# Patient Record
Sex: Female | Born: 1935 | Race: White | Hispanic: No | Marital: Married | Smoking: Never smoker
Health system: Southern US, Community
[De-identification: ages and names within clinical notes are randomized; demographics above are authoritative.]

## PROBLEM LIST (undated history)

## (undated) DIAGNOSIS — R112 Nausea with vomiting, unspecified: Secondary | ICD-10-CM

## (undated) DIAGNOSIS — Z9889 Other specified postprocedural states: Secondary | ICD-10-CM

## (undated) DIAGNOSIS — E43 Unspecified severe protein-calorie malnutrition: Secondary | ICD-10-CM

## (undated) DIAGNOSIS — C8590 Non-Hodgkin lymphoma, unspecified, unspecified site: Secondary | ICD-10-CM

## (undated) DIAGNOSIS — R251 Tremor, unspecified: Secondary | ICD-10-CM

## (undated) HISTORY — DX: Hereditary hemochromatosis: E83.110

## (undated) HISTORY — PX: OTHER SURGICAL HISTORY: SHX169

## (undated) HISTORY — PX: ABDOMINAL HYSTERECTOMY: SHX81

## (undated) HISTORY — PX: CHOLECYSTECTOMY: SHX55

## (undated) HISTORY — PX: TONSILLECTOMY: SUR1361

## (undated) HISTORY — PX: CATARACT EXTRACTION, BILATERAL: SHX1313

## (undated) HISTORY — PX: JOINT REPLACEMENT: SHX530

---

## 1998-09-08 ENCOUNTER — Other Ambulatory Visit: Admission: RE | Admit: 1998-09-08 | Discharge: 1998-09-08 | Payer: Self-pay | Admitting: Obstetrics and Gynecology

## 1999-08-07 ENCOUNTER — Other Ambulatory Visit: Admission: RE | Admit: 1999-08-07 | Discharge: 1999-08-07 | Payer: Self-pay | Admitting: Obstetrics and Gynecology

## 2000-07-14 ENCOUNTER — Ambulatory Visit (HOSPITAL_COMMUNITY): Admission: RE | Admit: 2000-07-14 | Discharge: 2000-07-14 | Payer: Self-pay | Admitting: Obstetrics and Gynecology

## 2000-07-14 ENCOUNTER — Encounter: Payer: Self-pay | Admitting: Obstetrics and Gynecology

## 2000-07-19 ENCOUNTER — Encounter: Admission: RE | Admit: 2000-07-19 | Discharge: 2000-07-19 | Payer: Self-pay | Admitting: Obstetrics and Gynecology

## 2000-07-19 ENCOUNTER — Encounter: Payer: Self-pay | Admitting: Obstetrics and Gynecology

## 2000-10-04 ENCOUNTER — Other Ambulatory Visit: Admission: RE | Admit: 2000-10-04 | Discharge: 2000-10-04 | Payer: Self-pay | Admitting: Obstetrics and Gynecology

## 2001-07-20 ENCOUNTER — Encounter: Payer: Self-pay | Admitting: Obstetrics and Gynecology

## 2001-07-20 ENCOUNTER — Ambulatory Visit (HOSPITAL_COMMUNITY): Admission: RE | Admit: 2001-07-20 | Discharge: 2001-07-20 | Payer: Self-pay | Admitting: Obstetrics and Gynecology

## 2002-01-23 ENCOUNTER — Other Ambulatory Visit: Admission: RE | Admit: 2002-01-23 | Discharge: 2002-01-23 | Payer: Self-pay | Admitting: Obstetrics and Gynecology

## 2002-08-03 ENCOUNTER — Encounter: Admission: RE | Admit: 2002-08-03 | Discharge: 2002-08-03 | Payer: Self-pay | Admitting: Internal Medicine

## 2002-08-03 ENCOUNTER — Encounter: Payer: Self-pay | Admitting: Obstetrics and Gynecology

## 2003-08-30 ENCOUNTER — Encounter: Payer: Self-pay | Admitting: Internal Medicine

## 2003-08-30 ENCOUNTER — Inpatient Hospital Stay (HOSPITAL_COMMUNITY): Admission: EM | Admit: 2003-08-30 | Discharge: 2003-09-05 | Payer: Self-pay | Admitting: Emergency Medicine

## 2004-03-05 ENCOUNTER — Ambulatory Visit (HOSPITAL_COMMUNITY): Admission: RE | Admit: 2004-03-05 | Discharge: 2004-03-05 | Payer: Self-pay | Admitting: Internal Medicine

## 2004-03-24 ENCOUNTER — Encounter (HOSPITAL_COMMUNITY): Admission: RE | Admit: 2004-03-24 | Discharge: 2004-06-22 | Payer: Self-pay | Admitting: Internal Medicine

## 2004-06-23 ENCOUNTER — Encounter (HOSPITAL_COMMUNITY): Admission: RE | Admit: 2004-06-23 | Discharge: 2004-09-21 | Payer: Self-pay | Admitting: Internal Medicine

## 2004-07-20 ENCOUNTER — Observation Stay (HOSPITAL_COMMUNITY): Admission: AD | Admit: 2004-07-20 | Discharge: 2004-07-20 | Payer: Self-pay | Admitting: Orthopedic Surgery

## 2005-04-15 ENCOUNTER — Ambulatory Visit (HOSPITAL_COMMUNITY): Admission: RE | Admit: 2005-04-15 | Discharge: 2005-04-15 | Payer: Self-pay | Admitting: Obstetrics and Gynecology

## 2005-09-21 ENCOUNTER — Ambulatory Visit: Payer: Self-pay | Admitting: Hematology & Oncology

## 2005-11-23 ENCOUNTER — Ambulatory Visit: Payer: Self-pay | Admitting: Hematology & Oncology

## 2006-01-25 ENCOUNTER — Ambulatory Visit: Payer: Self-pay | Admitting: Hematology & Oncology

## 2006-04-11 ENCOUNTER — Ambulatory Visit: Payer: Self-pay | Admitting: Hematology & Oncology

## 2006-04-14 LAB — CBC WITH DIFFERENTIAL/PLATELET
Basophils Absolute: 0 10*3/uL (ref 0.0–0.1)
Eosinophils Absolute: 0.2 10*3/uL (ref 0.0–0.5)
HGB: 15.1 g/dL (ref 11.6–15.9)
MCV: 93.9 fL (ref 81.0–101.0)
MONO%: 6.1 % (ref 0.0–13.0)
NEUT#: 6.4 10*3/uL (ref 1.5–6.5)
RDW: 13.3 % (ref 11.3–14.5)

## 2006-05-10 ENCOUNTER — Ambulatory Visit (HOSPITAL_COMMUNITY): Admission: RE | Admit: 2006-05-10 | Discharge: 2006-05-10 | Payer: Self-pay | Admitting: Obstetrics and Gynecology

## 2006-06-10 ENCOUNTER — Ambulatory Visit: Payer: Self-pay | Admitting: Hematology & Oncology

## 2006-06-15 LAB — CBC WITH DIFFERENTIAL/PLATELET
BASO%: 0.4 % (ref 0.0–2.0)
Basophils Absolute: 0 10*3/uL (ref 0.0–0.1)
Eosinophils Absolute: 0.2 10*3/uL (ref 0.0–0.5)
HCT: 45.5 % (ref 34.8–46.6)
HGB: 15.9 g/dL (ref 11.6–15.9)
MONO#: 0.8 10*3/uL (ref 0.1–0.9)
NEUT#: 7.7 10*3/uL — ABNORMAL HIGH (ref 1.5–6.5)
NEUT%: 66.8 % (ref 39.6–76.8)
WBC: 11.6 10*3/uL — ABNORMAL HIGH (ref 3.9–10.0)
lymph#: 2.8 10*3/uL (ref 0.9–3.3)

## 2006-08-08 ENCOUNTER — Ambulatory Visit: Payer: Self-pay | Admitting: Hematology & Oncology

## 2006-08-10 ENCOUNTER — Encounter: Admission: RE | Admit: 2006-08-10 | Discharge: 2006-08-10 | Payer: Self-pay | Admitting: Orthopedic Surgery

## 2006-08-10 LAB — CBC WITH DIFFERENTIAL/PLATELET
Eosinophils Absolute: 0.2 10*3/uL (ref 0.0–0.5)
HGB: 15.8 g/dL (ref 11.6–15.9)
MONO#: 0.7 10*3/uL (ref 0.1–0.9)
NEUT#: 7.1 10*3/uL — ABNORMAL HIGH (ref 1.5–6.5)
RBC: 4.62 10*6/uL (ref 3.70–5.32)
RDW: 13 % (ref 11.3–14.5)
WBC: 11.2 10*3/uL — ABNORMAL HIGH (ref 3.9–10.0)

## 2006-08-10 LAB — FERRITIN: Ferritin: 32 ng/mL (ref 10–291)

## 2006-08-17 ENCOUNTER — Ambulatory Visit (HOSPITAL_BASED_OUTPATIENT_CLINIC_OR_DEPARTMENT_OTHER): Admission: RE | Admit: 2006-08-17 | Discharge: 2006-08-17 | Payer: Self-pay | Admitting: Orthopedic Surgery

## 2006-11-04 ENCOUNTER — Ambulatory Visit: Payer: Self-pay | Admitting: Hematology & Oncology

## 2006-11-09 LAB — CBC WITH DIFFERENTIAL/PLATELET
Basophils Absolute: 0.1 10*3/uL (ref 0.0–0.1)
Eosinophils Absolute: 0.2 10*3/uL (ref 0.0–0.5)
HGB: 15.9 g/dL (ref 11.6–15.9)
MONO#: 0.8 10*3/uL (ref 0.1–0.9)
NEUT#: 7.3 10*3/uL — ABNORMAL HIGH (ref 1.5–6.5)
RDW: 12.9 % (ref 11.3–14.5)
lymph#: 4.7 10*3/uL — ABNORMAL HIGH (ref 0.9–3.3)

## 2006-11-09 LAB — IRON AND TIBC
%SAT: 23 % (ref 20–55)
Iron: 88 ug/dL (ref 42–145)
TIBC: 377 ug/dL (ref 250–470)
UIBC: 289 ug/dL

## 2006-11-09 LAB — FERRITIN: Ferritin: 58 ng/mL (ref 10–291)

## 2007-02-06 ENCOUNTER — Ambulatory Visit: Payer: Self-pay | Admitting: Hematology & Oncology

## 2007-02-08 LAB — CBC WITH DIFFERENTIAL/PLATELET
BASO%: 0.3 % (ref 0.0–2.0)
Eosinophils Absolute: 0.2 10*3/uL (ref 0.0–0.5)
MCHC: 35.8 g/dL (ref 32.0–36.0)
MCV: 95.9 fL (ref 81.0–101.0)
MONO#: 0.8 10*3/uL (ref 0.1–0.9)
MONO%: 7.7 % (ref 0.0–13.0)
NEUT#: 5.9 10*3/uL (ref 1.5–6.5)
RBC: 4.8 10*6/uL (ref 3.70–5.32)
RDW: 12.9 % (ref 11.3–14.5)
WBC: 10 10*3/uL (ref 3.9–10.0)

## 2007-02-08 LAB — IRON AND TIBC
Iron: 145 ug/dL (ref 42–145)
UIBC: 199 ug/dL

## 2007-02-08 LAB — FERRITIN: Ferritin: 64 ng/mL (ref 10–291)

## 2007-05-08 ENCOUNTER — Ambulatory Visit: Payer: Self-pay | Admitting: Hematology & Oncology

## 2007-05-10 LAB — CBC WITH DIFFERENTIAL/PLATELET
BASO%: 0.6 % (ref 0.0–2.0)
Basophils Absolute: 0.1 10*3/uL (ref 0.0–0.1)
EOS%: 2 % (ref 0.0–7.0)
HCT: 41.5 % (ref 34.8–46.6)
LYMPH%: 39.7 % (ref 14.0–48.0)
MCH: 34.3 pg — ABNORMAL HIGH (ref 26.0–34.0)
MCHC: 35.6 g/dL (ref 32.0–36.0)
MCV: 96.5 fL (ref 81.0–101.0)
MONO%: 6.8 % (ref 0.0–13.0)
NEUT%: 50.9 % (ref 39.6–76.8)
lymph#: 4.9 10*3/uL — ABNORMAL HIGH (ref 0.9–3.3)

## 2007-05-10 LAB — IRON AND TIBC: TIBC: 296 ug/dL (ref 250–470)

## 2007-05-15 ENCOUNTER — Ambulatory Visit (HOSPITAL_COMMUNITY): Admission: RE | Admit: 2007-05-15 | Discharge: 2007-05-15 | Payer: Self-pay | Admitting: Obstetrics and Gynecology

## 2007-08-14 ENCOUNTER — Ambulatory Visit: Payer: Self-pay | Admitting: Hematology & Oncology

## 2007-08-16 LAB — CBC WITH DIFFERENTIAL/PLATELET
Eosinophils Absolute: 0.3 10*3/uL (ref 0.0–0.5)
LYMPH%: 28.2 % (ref 14.0–48.0)
MCH: 35 pg — ABNORMAL HIGH (ref 26.0–34.0)
MCV: 97.1 fL (ref 81.0–101.0)
MONO%: 9 % (ref 0.0–13.0)
NEUT#: 7.1 10*3/uL — ABNORMAL HIGH (ref 1.5–6.5)
Platelets: 308 10*3/uL (ref 145–400)
RBC: 4.22 10*6/uL (ref 3.70–5.32)

## 2007-08-16 LAB — COMPREHENSIVE METABOLIC PANEL
Alkaline Phosphatase: 67 U/L (ref 39–117)
BUN: 25 mg/dL — ABNORMAL HIGH (ref 6–23)
Glucose, Bld: 118 mg/dL — ABNORMAL HIGH (ref 70–99)
Sodium: 138 mEq/L (ref 135–145)
Total Bilirubin: 0.7 mg/dL (ref 0.3–1.2)

## 2007-08-16 LAB — IRON AND TIBC
%SAT: 41 % (ref 20–55)
Iron: 126 ug/dL (ref 42–145)
UIBC: 180 ug/dL

## 2007-11-06 ENCOUNTER — Ambulatory Visit: Payer: Self-pay | Admitting: Hematology & Oncology

## 2008-02-05 ENCOUNTER — Ambulatory Visit: Payer: Self-pay | Admitting: Hematology & Oncology

## 2008-02-07 LAB — CBC WITH DIFFERENTIAL/PLATELET
BASO%: 0.1 % (ref 0.0–2.0)
Eosinophils Absolute: 0.2 10*3/uL (ref 0.0–0.5)
HCT: 44.3 % (ref 34.8–46.6)
MCHC: 35.1 g/dL (ref 32.0–36.0)
MONO#: 0.9 10*3/uL (ref 0.1–0.9)
NEUT#: 6.2 10*3/uL (ref 1.5–6.5)
RBC: 4.65 10*6/uL (ref 3.70–5.32)
WBC: 11.1 10*3/uL — ABNORMAL HIGH (ref 3.9–10.0)
lymph#: 3.8 10*3/uL — ABNORMAL HIGH (ref 0.9–3.3)

## 2008-02-07 LAB — FERRITIN: Ferritin: 72 ng/mL (ref 10–291)

## 2008-05-06 ENCOUNTER — Ambulatory Visit: Payer: Self-pay | Admitting: Hematology & Oncology

## 2008-05-19 LAB — FERRITIN: Ferritin: 69 ng/mL (ref 10–291)

## 2008-06-06 ENCOUNTER — Ambulatory Visit (HOSPITAL_BASED_OUTPATIENT_CLINIC_OR_DEPARTMENT_OTHER): Admission: RE | Admit: 2008-06-06 | Discharge: 2008-06-06 | Payer: Self-pay | Admitting: Obstetrics and Gynecology

## 2008-06-17 ENCOUNTER — Encounter: Admission: RE | Admit: 2008-06-17 | Discharge: 2008-06-17 | Payer: Self-pay | Admitting: Obstetrics and Gynecology

## 2008-08-13 ENCOUNTER — Ambulatory Visit: Payer: Self-pay | Admitting: Hematology & Oncology

## 2008-08-14 LAB — HEPATIC FUNCTION PANEL
ALT: 12 U/L (ref 0–35)
Alkaline Phosphatase: 75 U/L (ref 39–117)
Bilirubin, Direct: 0.1 mg/dL (ref 0.0–0.3)
Indirect Bilirubin: 0.7 mg/dL (ref 0.0–0.9)
Total Protein: 6.9 g/dL (ref 6.0–8.3)

## 2008-08-14 LAB — CBC WITH DIFFERENTIAL (CANCER CENTER ONLY)
BASO%: 1.1 % (ref 0.0–2.0)
EOS%: 3 % (ref 0.0–7.0)
LYMPH%: 34.1 % (ref 14.0–48.0)
MCH: 33.1 pg (ref 26.0–34.0)
MCHC: 34.7 g/dL (ref 32.0–36.0)
MCV: 95 fL (ref 81–101)
MONO#: 0.5 10*3/uL (ref 0.1–0.9)
MONO%: 5.7 % (ref 0.0–13.0)
Platelets: 284 10*3/uL (ref 145–400)
RDW: 11.4 % (ref 10.5–14.6)
WBC: 9.5 10*3/uL (ref 3.9–10.0)

## 2008-08-14 LAB — FERRITIN: Ferritin: 80 ng/mL (ref 10–291)

## 2008-08-14 LAB — IRON AND TIBC: TIBC: 318 ug/dL (ref 250–470)

## 2008-12-04 ENCOUNTER — Ambulatory Visit: Payer: Self-pay | Admitting: Hematology & Oncology

## 2009-03-03 ENCOUNTER — Inpatient Hospital Stay (HOSPITAL_COMMUNITY): Admission: RE | Admit: 2009-03-03 | Discharge: 2009-03-07 | Payer: Self-pay | Admitting: Orthopedic Surgery

## 2009-03-04 ENCOUNTER — Ambulatory Visit: Payer: Self-pay | Admitting: Physical Medicine & Rehabilitation

## 2009-04-01 ENCOUNTER — Encounter: Admission: RE | Admit: 2009-04-01 | Discharge: 2009-05-29 | Payer: Self-pay | Admitting: Orthopedic Surgery

## 2009-04-08 ENCOUNTER — Ambulatory Visit: Payer: Self-pay | Admitting: Hematology & Oncology

## 2009-04-17 LAB — CBC WITH DIFFERENTIAL (CANCER CENTER ONLY)
Eosinophils Absolute: 0.4 10*3/uL (ref 0.0–0.5)
LYMPH%: 36 % (ref 14.0–48.0)
MCH: 34.8 pg — ABNORMAL HIGH (ref 26.0–34.0)
MCV: 101 fL (ref 81–101)
MONO%: 7.7 % (ref 0.0–13.0)
NEUT%: 52.3 % (ref 39.6–80.0)
Platelets: 372 10*3/uL (ref 145–400)
RBC: 4.34 10*6/uL (ref 3.70–5.32)

## 2009-04-17 LAB — HEPATIC FUNCTION PANEL
ALT: 11 U/L (ref 0–35)
Alkaline Phosphatase: 102 U/L (ref 39–117)
Bilirubin, Direct: 0.1 mg/dL (ref 0.0–0.3)
Indirect Bilirubin: 0.2 mg/dL (ref 0.0–0.9)
Total Protein: 6.6 g/dL (ref 6.0–8.3)

## 2009-04-17 LAB — IRON AND TIBC
%SAT: 37 % (ref 20–55)
Iron: 116 ug/dL (ref 42–145)

## 2009-04-22 ENCOUNTER — Emergency Department (HOSPITAL_BASED_OUTPATIENT_CLINIC_OR_DEPARTMENT_OTHER): Admission: EM | Admit: 2009-04-22 | Discharge: 2009-04-23 | Payer: Self-pay | Admitting: Emergency Medicine

## 2009-04-29 LAB — CBC WITH DIFFERENTIAL (CANCER CENTER ONLY)
BASO#: 0.2 10*3/uL (ref 0.0–0.2)
Eosinophils Absolute: 0.3 10*3/uL (ref 0.0–0.5)
HGB: 12.9 g/dL (ref 11.6–15.9)
LYMPH%: 34.7 % (ref 14.0–48.0)
MCH: 33.8 pg (ref 26.0–34.0)
MCV: 101 fL (ref 81–101)
MONO%: 8.3 % (ref 0.0–13.0)
NEUT#: 7.2 10*3/uL — ABNORMAL HIGH (ref 1.5–6.5)
RBC: 3.82 10*6/uL (ref 3.70–5.32)

## 2009-07-16 ENCOUNTER — Ambulatory Visit: Payer: Self-pay | Admitting: Diagnostic Radiology

## 2009-07-16 ENCOUNTER — Ambulatory Visit (HOSPITAL_BASED_OUTPATIENT_CLINIC_OR_DEPARTMENT_OTHER): Admission: RE | Admit: 2009-07-16 | Discharge: 2009-07-16 | Payer: Self-pay | Admitting: Obstetrics and Gynecology

## 2009-08-05 ENCOUNTER — Ambulatory Visit: Payer: Self-pay | Admitting: Hematology & Oncology

## 2009-12-09 ENCOUNTER — Ambulatory Visit: Payer: Self-pay | Admitting: Hematology & Oncology

## 2009-12-11 LAB — CBC WITH DIFFERENTIAL (CANCER CENTER ONLY)
BASO#: 0.1 10*3/uL (ref 0.0–0.2)
EOS%: 2 % (ref 0.0–7.0)
Eosinophils Absolute: 0.2 10*3/uL (ref 0.0–0.5)
HCT: 46.3 % (ref 34.8–46.6)
HGB: 15.7 g/dL (ref 11.6–15.9)
MCV: 98 fL (ref 81–101)
MONO%: 7.2 % (ref 0.0–13.0)
NEUT%: 51.6 % (ref 39.6–80.0)
RDW: 11.4 % (ref 10.5–14.6)
WBC: 11.2 10*3/uL — ABNORMAL HIGH (ref 3.9–10.0)

## 2009-12-11 LAB — HEPATIC FUNCTION PANEL
AST: 14 U/L (ref 0–37)
Albumin: 4 g/dL (ref 3.5–5.2)
Total Bilirubin: 0.6 mg/dL (ref 0.3–1.2)

## 2009-12-11 LAB — AFP TUMOR MARKER: AFP-Tumor Marker: 3.9 ng/mL (ref 0.0–8.0)

## 2010-04-06 ENCOUNTER — Inpatient Hospital Stay (HOSPITAL_COMMUNITY): Admission: RE | Admit: 2010-04-06 | Discharge: 2010-04-11 | Payer: Self-pay | Admitting: Orthopedic Surgery

## 2010-05-27 ENCOUNTER — Ambulatory Visit: Payer: Self-pay | Admitting: Hematology & Oncology

## 2010-06-30 ENCOUNTER — Ambulatory Visit: Payer: Self-pay | Admitting: Hematology & Oncology

## 2010-07-01 LAB — CBC WITH DIFFERENTIAL (CANCER CENTER ONLY)
BASO#: 0.1 10*3/uL (ref 0.0–0.2)
Eosinophils Absolute: 0.2 10*3/uL (ref 0.0–0.5)
HCT: 43 % (ref 34.8–46.6)
HGB: 14.8 g/dL (ref 11.6–15.9)
LYMPH#: 3.5 10*3/uL — ABNORMAL HIGH (ref 0.9–3.3)
MCHC: 34.3 g/dL (ref 32.0–36.0)
NEUT#: 6 10*3/uL (ref 1.5–6.5)
NEUT%: 56.6 % (ref 39.6–80.0)
WBC: 10.6 10*3/uL — ABNORMAL HIGH (ref 3.9–10.0)

## 2010-07-01 LAB — FERRITIN: Ferritin: 35 ng/mL (ref 10–291)

## 2010-07-01 LAB — TECHNOLOGIST REVIEW CHCC SATELLITE

## 2010-08-17 ENCOUNTER — Ambulatory Visit (HOSPITAL_BASED_OUTPATIENT_CLINIC_OR_DEPARTMENT_OTHER): Admission: RE | Admit: 2010-08-17 | Discharge: 2010-08-17 | Payer: Self-pay | Admitting: Obstetrics and Gynecology

## 2010-08-17 ENCOUNTER — Ambulatory Visit: Payer: Self-pay | Admitting: Diagnostic Radiology

## 2010-12-02 ENCOUNTER — Ambulatory Visit: Payer: Self-pay | Admitting: Hematology & Oncology

## 2010-12-03 LAB — COMPREHENSIVE METABOLIC PANEL
ALT: <8 U/L (ref 0–35)
AST: 14 U/L (ref 0–37)
Albumin: 4.2 g/dL (ref 3.5–5.2)
Alkaline Phosphatase: 96 U/L (ref 39–117)
BUN: 20 mg/dL (ref 6–23)
CO2: 25 mEq/L (ref 19–32)
Calcium: 8.9 mg/dL (ref 8.4–10.5)
Chloride: 105 mEq/L (ref 96–112)
Creatinine, Ser: 0.69 mg/dL (ref 0.40–1.20)
Glucose, Bld: 115 mg/dL — ABNORMAL HIGH (ref 70–99)
Potassium: 4.2 mEq/L (ref 3.5–5.3)
Sodium: 139 mEq/L (ref 135–145)
Total Bilirubin: 1.1 mg/dL (ref 0.3–1.2)
Total Protein: 6.7 g/dL (ref 6.0–8.3)

## 2010-12-03 LAB — CBC WITH DIFFERENTIAL (CANCER CENTER ONLY)
BASO#: 0.2 10*3/uL (ref 0.0–0.2)
BASO%: 1.3 % (ref 0.0–2.0)
EOS%: 2.2 % (ref 0.0–7.0)
Eosinophils Absolute: 0.3 10*3/uL (ref 0.0–0.5)
HCT: 43.4 % (ref 34.8–46.6)
HGB: 15 g/dL (ref 11.6–15.9)
LYMPH#: 3.6 10*3/uL — ABNORMAL HIGH (ref 0.9–3.3)
LYMPH%: 28.9 % (ref 14.0–48.0)
MCH: 34 pg (ref 26.0–34.0)
MCHC: 34.6 g/dL (ref 32.0–36.0)
MCV: 98 fL (ref 81–101)
MONO#: 0.9 10*3/uL (ref 0.1–0.9)
MONO%: 7 % (ref 0.0–13.0)
NEUT#: 7.4 10*3/uL — ABNORMAL HIGH (ref 1.5–6.5)
NEUT%: 60.6 % (ref 39.6–80.0)
Platelets: 325 10*3/uL (ref 145–400)
RBC: 4.43 10*6/uL (ref 3.70–5.32)
RDW: 10.6 % (ref 10.5–14.6)
WBC: 12.3 10*3/uL — ABNORMAL HIGH (ref 3.9–10.0)

## 2010-12-03 LAB — FERRITIN: Ferritin: 46 ng/mL (ref 10–291)

## 2010-12-12 ENCOUNTER — Encounter: Payer: Self-pay | Admitting: Obstetrics and Gynecology

## 2010-12-13 ENCOUNTER — Encounter: Payer: Self-pay | Admitting: Obstetrics and Gynecology

## 2011-02-08 LAB — CBC
HCT: 37.4 % (ref 36.0–46.0)
HCT: 41.5 % (ref 36.0–46.0)
HCT: 41.7 % (ref 36.0–46.0)
Hemoglobin: 13.2 g/dL (ref 12.0–15.0)
Hemoglobin: 14.4 g/dL (ref 12.0–15.0)
Hemoglobin: 14.6 g/dL (ref 12.0–15.0)
MCHC: 34.5 g/dL (ref 30.0–36.0)
MCHC: 35.4 g/dL (ref 30.0–36.0)
MCV: 100.4 fL — ABNORMAL HIGH (ref 78.0–100.0)
MCV: 102.6 fL — ABNORMAL HIGH (ref 78.0–100.0)
MCV: 103.4 fL — ABNORMAL HIGH (ref 78.0–100.0)
Platelets: 285 10*3/uL (ref 150–400)
RBC: 3.81 MIL/uL — ABNORMAL LOW (ref 3.87–5.11)
RBC: 4.16 MIL/uL (ref 3.87–5.11)
RBC: 4.18 MIL/uL (ref 3.87–5.11)
RDW: 12.5 % (ref 11.5–15.5)
RDW: 12.6 % (ref 11.5–15.5)
RDW: 13 % (ref 11.5–15.5)
RDW: 13 % (ref 11.5–15.5)
WBC: 14.2 10*3/uL — ABNORMAL HIGH (ref 4.0–10.5)
WBC: 17.9 10*3/uL — ABNORMAL HIGH (ref 4.0–10.5)
WBC: 20.2 10*3/uL — ABNORMAL HIGH (ref 4.0–10.5)

## 2011-02-08 LAB — PROTIME-INR
INR: 1.5 — ABNORMAL HIGH (ref 0.00–1.49)
INR: 1.95 — ABNORMAL HIGH (ref 0.00–1.49)
INR: 2.09 — ABNORMAL HIGH (ref 0.00–1.49)
Prothrombin Time: 14.3 seconds (ref 11.6–15.2)
Prothrombin Time: 23.3 seconds — ABNORMAL HIGH (ref 11.6–15.2)

## 2011-02-08 LAB — BASIC METABOLIC PANEL
CO2: 24 mEq/L (ref 19–32)
Calcium: 8.4 mg/dL (ref 8.4–10.5)
Potassium: 3.4 mEq/L — ABNORMAL LOW (ref 3.5–5.1)
Sodium: 130 mEq/L — ABNORMAL LOW (ref 135–145)

## 2011-02-08 LAB — GLUCOSE, CAPILLARY: Glucose-Capillary: 148 mg/dL — ABNORMAL HIGH (ref 70–99)

## 2011-02-09 LAB — TYPE AND SCREEN: ABO/RH(D): O POS

## 2011-02-09 LAB — PROTIME-INR
INR: 0.94 (ref 0.00–1.49)
Prothrombin Time: 12.5 seconds (ref 11.6–15.2)

## 2011-02-09 LAB — BASIC METABOLIC PANEL
GFR calc Af Amer: >60 mL/min (ref >60)
GFR calc non Af Amer: >60 mL/min (ref >60)
Potassium: 3.6 mEq/L (ref 3.5–5.1)
Sodium: 137 mEq/L (ref 135–145)

## 2011-02-09 LAB — APTT: aPTT: 30 seconds (ref 24–37)

## 2011-03-03 LAB — URINE MICROSCOPIC-ADD ON

## 2011-03-03 LAB — URINALYSIS, ROUTINE W REFLEX MICROSCOPIC
Glucose, UA: NEGATIVE mg/dL
Ketones, ur: NEGATIVE mg/dL
Nitrite: POSITIVE — AB
Protein, ur: NEGATIVE mg/dL
Urobilinogen, UA: 0.2 mg/dL (ref 0.0–1.0)

## 2011-03-03 LAB — CBC
HCT: 37.2 % (ref 36.0–46.0)
HCT: 45 % (ref 36.0–46.0)
Hemoglobin: 13.8 g/dL (ref 12.0–15.0)
Hemoglobin: 15.7 g/dL — ABNORMAL HIGH (ref 12.0–15.0)
MCHC: 35 g/dL (ref 30.0–36.0)
MCHC: 35.4 g/dL (ref 30.0–36.0)
MCV: 98.3 fL (ref 78.0–100.0)
MCV: 98.9 fL (ref 78.0–100.0)
Platelets: 274 10*3/uL (ref 150–400)
Platelets: 282 10*3/uL (ref 150–400)
Platelets: 283 10*3/uL (ref 150–400)
Platelets: 283 10*3/uL (ref 150–400)
RDW: 12.7 % (ref 11.5–15.5)
RDW: 12.7 % (ref 11.5–15.5)
RDW: 12.8 % (ref 11.5–15.5)
RDW: 12.9 % (ref 11.5–15.5)
WBC: 11.4 10*3/uL — ABNORMAL HIGH (ref 4.0–10.5)
WBC: 17 10*3/uL — ABNORMAL HIGH (ref 4.0–10.5)

## 2011-03-03 LAB — DIFFERENTIAL
Eosinophils Absolute: 0.3 10*3/uL (ref 0.0–0.7)
Eosinophils Relative: 3 % (ref 0–5)
Lymphs Abs: 4.1 10*3/uL — ABNORMAL HIGH (ref 0.7–4.0)
Monocytes Absolute: 0.8 10*3/uL (ref 0.1–1.0)

## 2011-03-03 LAB — BASIC METABOLIC PANEL
BUN: 11 mg/dL (ref 6–23)
BUN: 19 mg/dL (ref 6–23)
CO2: 27 mEq/L (ref 19–32)
Calcium: 8.5 mg/dL (ref 8.4–10.5)
Chloride: 102 mEq/L (ref 96–112)
Chloride: 98 mEq/L (ref 96–112)
Creatinine, Ser: 0.68 mg/dL (ref 0.4–1.2)
GFR calc Af Amer: >60 mL/min (ref >60)
Glucose, Bld: 137 mg/dL — ABNORMAL HIGH (ref 70–99)
Glucose, Bld: 91 mg/dL (ref 70–99)
Potassium: 4.3 mEq/L (ref 3.5–5.1)

## 2011-03-03 LAB — PROTIME-INR
INR: 1.1 (ref 0.00–1.49)
INR: 1.3 (ref 0.00–1.49)
Prothrombin Time: 12.3 seconds (ref 11.6–15.2)
Prothrombin Time: 14 seconds (ref 11.6–15.2)
Prothrombin Time: 20.6 seconds — ABNORMAL HIGH (ref 11.6–15.2)

## 2011-03-03 LAB — TYPE AND SCREEN: Antibody Screen: NEGATIVE

## 2011-03-03 LAB — ABO/RH: ABO/RH(D): O POS

## 2011-04-06 NOTE — Discharge Summary (Signed)
[name], [name]          ACCOUNT NO.:  [id number]   MEDICAL RECORD NO.:  [id number]          PATIENT TYPE:  REC   LOCATION:  [facility]                         FACILITY:  [facility]   PHYSICIAN:  Feliberto Gottron. Turner Daniels, M.D.   DATE OF BIRTH:  05-31-1931   DATE OF ADMISSION:  04/01/2009  DATE OF DISCHARGE:                               DISCHARGE SUMMARY   CHIEF COMPLAINT:  Left knee pain.   HISTORY OF PRESENT ILLNESS:  This is a 77 year old lady who complains of  severe unremitting pain in her left knee despite conservative treatment  with arthroscopy and steroid injections.  She desires a surgical  intervention at this time.  All risks and benefits were discussed with  the patient.   PAST MEDICAL HISTORY:  Significant for a pulmonary embolism in 2003 and  hematochromatosis.   SURGICAL HISTORY:  She has had hysterectomy, cholecystectomy, and left  knee arthroscopy.   SOCIAL HISTORY:  She denies the use of tobacco and drinks occasional  alcohol.   FAMILY HISTORY:  Positive for coronary artery disease and hypertension.   ALLERGIES:  She has no known drug allergies.   CURRENT MEDICATIONS:  1. Maxzide 25 mg 1 p.o. daily.  2. Inderal 40 mg 1 p.o. b.i.d.  3. Neurontin 100 mg 1 p.o. q.i.d.  4. Vitamin D 50,000 units twice weekly.   PHYSICAL EXAMINATION:  Gross examination of left knee demonstrates the  patient to have tenderness to palpation along the lateral joint line.  Range of motion is estimated to be 0-120 degrees, these are vascularly  intact.  X-rays demonstrate bone-on-bone degenerative joint disease of  the left knee.   PREOPERATIVE LABORATORIES:  White blood cells 11.4, red blood cells  4.55, hemoglobin 15.7, hematocrit 45, and platelets 283.  PT 12.3, INR  is 0.9, and PTT 31.  Sodium 136 potassium 4.3, chloride 102, glucose 91,  BUN 19, and creatinine 0.72.  Urinalysis demonstrated positive nitrites  with small leukocyte esterase and many bacteria.   HOSPITAL COURSE:  Ms.  Howell was admitted to [facility] on March 03, 2009, and she underwent left total knee arthroplasty.  The procedure  was performed by Dr. Gean Birchwood and the patient tolerated well.  Two  Hemovac drains were placed into the left knee.  A perioperative Foley  catheter was placed.  The patient was in good condition and was  transferred to the orthopedic unit where she was placed on Lovenox and  Coumadin for DVT prophylaxis.  On first postoperative day, the patient  was awake and alert.  She denied any nausea or vomiting and complained  of minimal pain in her left knee.  Hemoglobin was 13.5.  Surgical drains  were removed.  Foley catheter was taken out after physical therapy.   On the second postoperative day, the patient was awake, alert, and  eating well.  Hemoglobin was 13.8 and she was afebrile.  She ambulated  190 feet with physical therapy.   On the third postoperative day, as the patient was passing flatus, but  not stool; she was confused, but this was thought to be secondary to  pain  medication, so her medicines were changed from Percocet to Darvocet  and the patient became much more alert.  Her dressing was changed and  her surgical incision was benign.  She ambulated 150 feet with physical  therapy.   On the fourth postoperative day, the patient was eating well and  ambulates independently.  Her pain was well controlled and she was much  more alert.  Hemoglobin was 13.0 and deemed to be discharged.   DISPOSITION:  Discharged home on March 07, 2009.  She was weightbearing  as tolerated and would return to the clinic in 7-10 days for x-rays and  staple removal.  Home health care will manage her wound.  She is to  continue physical therapy.   DISCHARGE MEDICATIONS:  __________ Darvocet-N 100 one to two tabs p.o.  q.4-6 h. p.r.n. pain and Coumadin dose per the pharmacy.  She is to take  aspirin __________.   FINAL DIAGNOSIS:  End-stage degenerative joint disease of the  left knee.      Wendy Harris, PA      Feliberto Gottron. Turner Daniels, M.D.  Electronically Signed    JW/MEDQ  D:  04/07/2009  T:  04/08/2009  Job:  [id number]

## 2011-04-06 NOTE — Op Note (Signed)
[name], [name]          ACCOUNT NO.:  [id number]   MEDICAL RECORD NO.:  [id number]          PATIENT TYPE:  INP   LOCATION:  5009                         FACILITY:  [facility]   PHYSICIAN:  Feliberto Gottron. Turner Daniels, M.D.   DATE OF BIRTH:  [birth year]   DATE OF PROCEDURE:  03/03/2009  DATE OF DISCHARGE:                               OPERATIVE REPORT   PREOPERATIVE DIAGNOSIS:  End-stage arthritis, left knee.   POSTOPERATIVE DIAGNOSIS:  End-stage arthritis, left knee.   PROCEDURE:  Left total knee arthroplasty using DePuy sigma RP  components, 3 femur, 3 tibia, 10-mm sigma RP bearing, and 35-mm patellar  button.   SURGEON:  Feliberto Gottron. Turner Daniels, MD   FIRST ASSISTANT:  Shirl Harris, PA-C   ANESTHETIC:  General endotracheal.   ESTIMATED BLOOD LOSS:  Minimal.   FLUID REPLACEMENT:  59 mL of crystalloid.   DRAINS PLACED:  Two medium Hemovacs and a Foley catheter.   URINE OUTPUT:  300 mL.   TOURNIQUET TIME:  one hour and 40 minutes.   INDICATIONS FOR PROCEDURE:  A 77 year old woman with end-stage arthritis  of the left knee with a valgus deformity, who has failed conservative  treatment, anti-inflammatory medicines, cortisone injections, and  physical therapy.  Plain radiographs were consistent with bone-on-bone  arthritic changes to the lateral compartment of the left knee on Rosen's  view.  She desires elective left total knee arthroplasty.  Risks and  benefits of surgery discussed, questions answered.   DESCRIPTION OF PROCEDURE:  The patient identified by armband and  underwent left femoral nerve block anesthetic at the block area at [facility].  She was then taken to operating room 4, the appropriate  anesthetic monitors were attached and general endotracheal anesthesia  was induced.  She received preoperative IV antibiotics.  A Foley  catheter was inserted, lateral, post, and foot position applied to the  table and tourniquet applied high to the left thigh.  The left  lower  extremity prepped and draped in usual sterile fashion from the ankle to  the tourniquet.  The standard time-out procedure was performed.  The  limb was wrapped with an Esmarch bandage, tourniquet inflated to 350  mmHg.  We began the procedure itself by making an anterior midline  incision starting one handbreadth above the patella and going over the  patella and then 1 cm medial, 2 and 3 cm distal to the tibial tubercle.  Small bleeders in the skin and subcutaneous tissue were identified and  cauterized.  The transverse retinaculum over the patella was identified,  incised and reflected medially allowing a medial parapatellar  arthrotomy.  The patella was everted.  The prepatellar fat pad resected.  Superficial medial collateral ligament was elevated off the proximal  tibia from anterior to posterior ligament intact distally using  electrocautery.  The knee was then hyperflexed with the patella everted,  exposing the menisci and the cruciate ligaments, which were then removed  with the electrocautery.  Using a posteromedial Z-retractor, McCall  retractor through the notch and a lateral Personal assistant.  The  proximal tibia was then further exposed and entered with  a DePuy step  drill coaxial with the tibial medullary canal.  The intramedullary rod  was then placed in the tibia.  The cutting guide set at 2 degrees  posterior slope and pinned into place allowing resection of about 8-9 mm  of bone medially and 6-7 mm of bone laterally because of the valgus  deformity.  Satisfied with proximal tibial resection, the distal femur  was then entered 2 mm anterior to the PCL origin followed by the IM rod  with a 5-degree left distal femoral cutting guide set at 11 mm and  pinned along the epicondylar axis.  The distal femoral cut was then  accomplished and a lateral femoral condyle as anticipated was deficient  by a couple of mm.  Having said that, we slowly had a good cut on the  lateral  side.  We then sized her #3 left femoral component in the  cutting block and placed an 0 degrees of external rotation and performed  our anterior-posterior chamfer cuts without difficulty followed by the  sigma RP box cut.  We then checked our extension gap and found it to be  excellent.  The patella was measured at 23 mm and felt to fit a 35  button.  The cutting guide was set at 15 and the posterior 8 mm of the  patella resected, sized to 35 button and drilled.  The knee was once  again hyperflexed exposing the proximal femur, which was sized to #3  tibial baseplate.  This was pinned into place followed by the smokestack  in the conical reamer and the Delta fin keel punch.  We then hammered  into place a 3 left trial femoral component and drilled a lug snapped in  a 10-mm sigma RP bearing, brought the knee to full extension and then  flexed to 120 degrees with good patellar tracking.  At this point, the  trial components were removed, all bony surface were water picked, clean  dried with suction and sponges, a double batch of DePuy HV cement with  1500 mg Zinacef was mixed at the back table, applied to all bony  metallic mating surfaces except for the posterior condyles of the femur  itself.  In order, we hammered into place a #3 tibial baseplate and  removed excess cement.  A 3 left femur and removed excess cement and a  35-mm patellar button was squeezed into place with a clamp, a 10-mm  sigma RP bearing, a real spacer was snapped into place and the knee  reduced, the knee was held in full extension, more cement was squeezed  out and removed with the [employer].  The cement was then allowed  to cure.  The clamp taken off the patella.  We checked our tracking one  more time.  Medium Hemovac drains were placed from an anterolateral  approach and the wound was irrigated out one more time with pulse lavage  normal saline.  The parapatellar arthrotomy was closed with running #1  Vicryl  suture.  The subcutaneous tissue with 0-0 and 2-0 undyed Vicryl  suture, and the skin with skin staples.  A dressing of Xeroform 4 x 4  dressing sponges, Webril, and Ace wrap was then applied.  Tourniquet let  down.  The patient was awakened and taken to recovery room without  difficulty.      Feliberto Gottron. Turner Daniels, M.D.  Electronically Signed     FJR/MEDQ  D:  03/03/2009  T:  03/03/2009  Job:  [id number] 

## 2011-04-09 NOTE — Discharge Summary (Signed)
NAME:  [name], [name]                    ACCOUNT NO.:  [id number]   MEDICAL RECORD NO.:  [id number]                   PATIENT TYPE:  INP   LOCATION:  3709                                 FACILITY:  [facility]   PHYSICIAN:  Larina Earthly, M.D.                     DATE OF BIRTH:  1931-07-06   DATE OF ADMISSION:  DATE OF DISCHARGE:  09/05/2003                                 DISCHARGE SUMMARY   DISCHARGE DIAGNOSES:  1. Bilateral pulmonary emboli.  2. Hypertension.  3. History of benign essential tremor.  4. Hypoxia secondary to number 1.  5. Deep vein thrombosis.   DISCHARGE MEDICATIONS:  1. Inderal 40 mg p.o. b.i.d. for benign, essential tremor.  2. Maxzide 75.5/ 25 one p.o. daily for hypertension.  3. Coumadin 1 mg p.o. q.h.s. starting on September 05, 2003, further dosing to     be adjusted by Dr. Jacky Kindle for PT/INR to be collected by [facility].   Chest CT with DVT protocol on August 30, 2003, reveals major pulmonary  artery emboli bilaterally, probable bilateral superficial femoral venous  thrombosis.  A bilateral lower-extremity deep vein thrombosis ultrasound was  performed.  However, official report is not in the chart.   EKG on August 30, 2003, reveals atrial tachycardia, right bundle branch  block, left posterior fascicular block.   LABORATORY DATA:  Admission CBC reveals a white blood cell count 4.8,  hemoglobin 15.1, hematocrit 43.5%, platelets count 319,000.  Discharge  hemoglobin was 12.6, hematocrit 35.5%.   Discharge PT/INR was 3.1 on September 05, 2003.  Admission PT/INR 1.0.  Admission sodium 137, potassium 4.5, glucose 133, BUN 18, creatinine 1.0,  total bilirubin 0.7.  Alkaline phosphatase 91.  AST 26, ALT 25.  Albumin  3.3.  Total bilirubin 0.7.  Total CK 23, CK-MB 2.3, troponin I, 0.03.   HISTORY OF PRESENT ILLNESS:  This is a pleasant 77 year old Caucasian female  who was admitted by Dr. Geoffry Paradise on August 30, 2003, after presenting  to the office  with progressive shortness of breath, with no other  symptomatology over at least the previous 12 hours.  However, upon  retrospect, the patient states that her symptoms might have started seven  days prior.  She denied any nausea, vomiting, chest pain, cough, sputum,  fevers, or chills.  She denied any lower extremity swelling.  She was  subsequently admitted for further management and evaluation.   ADMISSION MEDICATIONS:  1. Multivitamin.  2. Vitamin C.  3. Vitamin E.  4. Calcium.  5. Maxzide.  6. Premarin, 0.9 mg p.o. daily taken for multiple years. The patient does     have a history of a total abdominal hysterectomy in 1975.  7. Inderal.   HOSPITAL COURSE:  The patient underwent CT scan with DVT protocol as  mentioned above, and noted to have significant and extensive bilateral  pulmonary emboli.  She was anticoagulated with heparin with  initiation of  Coumadin protocol approximately one to two days later.  She was followed by  Dr. Shan Levans initially who agreed with management as mentioned above.  Her oxygen saturation was 95% on four liters initially, and her oxygen  requirement decreased steadily throughout her hospitalization.   DISCHARGE CONDITION:  Prior to discharge, she was ambulating with her  daughter, maintain an oxygen saturation of 92% on room air.  She suffered  from no additional respiratory distress or dyspnea.   Her vital signs remained stable, and her CBC with respect to hemoglobin and  hematocrit remained stable as documented in the chart.  She was deemed  appropriate for discharge on September 05, 2003, after an appropriate overlap  of therapeutic or super-therapeutic Coumadin levels with PT/INR and heparin  levels of greater than 48 hours.  Home Health will help manage her Coumadin  dosing, and the laboratory evaluation with respect to PT/INR.                                                  Larina Earthly, M.D.    RA/MEDQ  D:  09/05/2003  T:   09/05/2003  Job:  [id number]   cc:   Geoffry Paradise, M.D.  [address]  [place]  [place] [address]  Fax: [phone]

## 2011-04-09 NOTE — H&P (Signed)
NAME:  [name], [name] NO.:  [id number]   MEDICAL RECORD NO.:  [id number]                   PATIENT TYPE:  INP   LOCATION:                                       FACILITY:  [facility]   PHYSICIAN:  Geoffry Paradise, M.D.               DATE OF BIRTH:  04/12/1931   DATE OF ADMISSION:  08/30/2003  DATE OF DISCHARGE:                                HISTORY & PHYSICAL   CHIEF COMPLAINT:  Shortness of breath.   HISTORY OF PRESENT ILLNESS:  Ms. Zeimet is a pleasant 77 year old white  female with a history of hypertension, benign tremor presenting at this time  with several days of dyspnea.  She has been in excellent health and had her  last checkup approximately December 2003 at which time she was healthy.  She  has had a history of hypertension but no other cardiac risk factors.  She  has been apparently active with no symptomatology until the onset of some  dyspnea initially upon exertion and much more progressive over the last 12  hours.  She has had no nausea or vomiting.  Denies any chest pain.  She has  had no cough, sputum, fever, or chills.  She has had no lower extremity  swelling.  On presenting to the office urgently, she is markedly dyspneic  upon rest and exertion and is admitted for further management.   PAST MEDICAL HISTORY:   ALLERGIES:  NO KNOWN DRUG ALLERGIES   MEDICATIONS:  1. Multivitamin one daily.  2. Vitamin C 500 daily.  3. Vitamin E 200 daily.  4. Calcium 600 mg b.i.d.  5. Maxzide 25 one every day.  6. Premarin 0.9 daily.  7. Inderal 40 mg p.o. b.i.d.   MEDICAL ILLNESSES:  1. Essential hypertension.  2. Benign essential tremor.   Followed by Dr. Anne Hahn.   SURGICAL ILLNESSES:  1. Cholecystectomy in 1975.  2. Appendectomy in 1958.  3. Total abdominal hysterectomy in 1975.  4. Flexible sigmoidoscopy in 2000.   FAMILY HISTORY:  Noncontributory.   SOCIAL HISTORY:  The patient is married, has three children.  She does not  smoke or drink.   PHYSICAL EXAMINATION:  VITAL SIGNS:  Temperature is 98, blood pressure is  140/98, pulse is 120 regular, respiratory rate is 32 no labor at rest.  GENERAL:  Patient generally appears pale and ill.  Mild distress.  SKIN:  Warm, non diaphoretic, warm distally.  HEENT:  Normocephalic, atraumatic individual.  Anicteric.  Visual fields  intact.  Oropharynx benign.  Mild cyanosis perioral.  NECK:  No JVD or bruits.  LUNGS:  Clear.  CARDIOVASCULAR:  Regular, rate and rhythm.  Tachycardic.  No S3 or S4.  No  murmur.  ABDOMEN:  Soft, nontender, no hepatosplenomegaly.  BACK:  No CVA or spinal tenderness.  EXTREMITIES:  No cyanosis, clubbing or edema.  Warm distally.  Intact distal  pulses.  NEUROLOGIC:  Nonfocal.   LABORATORY  DATA:  Chest x-ray, no acute disease.   EKG sinus tachycardia with diffuse ST-T changes throughout the precordium.   O2 saturation 92%.   ASSESSMENT:  1. Dyspnea and electrocardiogram changes as well as sinus tachycardia.     Suspicious for a pulmonary embolism.  Doubt cardiac.  Only risk factor     being hormonal replacement therapy.  2. Essential hypertension, stable.  3. Benign essential tremor.   PLAN:  The patient will be admitted for STAT spiral CT rule out pulmonary  embolism.  She will be empirically anticoagulated and treated with empiric  nitrates pending the CT.  If the CT is indeed negative, a cardiac workup  will ensue.  Should the CT be positive, we will proceed with full aggressive  anticoagulation and discontinue hormonal replacement therapy.  There is  certainly no evidence of underlying malignancy at this time.                                                Geoffry Paradise, M.D.    RA/MEDQ  D:  08/30/2003  T:  08/31/2003  Job:  [id number]

## 2011-04-09 NOTE — Op Note (Signed)
NAME:  [name], [name] NO.:  [id number]   MEDICAL RECORD NO.:  [id number]                   PATIENT TYPE:  OBV   LOCATION:  2861                                 FACILITY:  [facility]   PHYSICIAN:  Feliberto Gottron. Turner Daniels, M.D.                DATE OF BIRTH:  Mar 30, 1931   DATE OF PROCEDURE:  07/20/2004  DATE OF DISCHARGE:                                 OPERATIVE REPORT   PREOPERATIVE DIAGNOSIS:  Grade 1 open left distal radius ulnar fracture or  Colles fracture.   POSTOPERATIVE DIAGNOSIS:  Grade 1 open left distal radius ulnar fracture or  Colles fracture.   PROCEDURE:  Left distal radius irrigation and debridement followed by open  reduction and internal fixation using a four hole two row distal [employer] DVR plate with six distal screws and four proximal screws.   SURGEON:  Feliberto Gottron. Turner Daniels, M.D.   FIRST ASSISTANT:  Skip Mayer, P.A.-C.   ANESTHESIA:  General endotracheal anesthesia.   ESTIMATED BLOOD LOSS:  Minimal.   FLUIDS REPLACED:  1 liter crystalloid.   DRAINS:  None.   TOURNIQUET TIME:  45 minutes.   INDICATIONS FOR PROCEDURE:  77 year old woman, otherwise healthy, slipped  and fell at home around 3 o'clock this afternoon sustained a grade I open  left distal radius and ulnar fracture, the opening being underneath the  ulna.  She had bayonet apposition and had blood on her arm when we met her.  She was referred by Dr. Syliva Overman.  Radiographs confirm the diagnosis and  she was prepared for urgent irrigation, debridement, open reduction and  internal fixation.   DESCRIPTION OF PROCEDURE:  The patient was identified by arm band and taken  to the operating room at [facility].  Appropriate anesthetic  monitors were attached and general endotracheal anesthesia was induced with  the patient in the supine position.  A tourniquet was applied high to the  left forearm and the left upper extremity prepped and draped in the usual  sterile  fashion from the fingertips to the tourniquet.  The limb was wrapped  with an Esmarch bandage and tourniquet inflated to 300 mmHg.  We began the  procedure by irrigating out the small 0.5 cm wounds over the ulnar styloid  volarly which did not have any gross dirt and appeared to be strictly inside  out injuries.  These were also debrided sharply getting back to healthy  edges of the skin and fat.  We then prepared for open reduction and internal  fixation by making a longitudinal incision over the FCR tendon with a 45  degree angulation distally over the flexor crease that went about 8 mm.  Small bleeders in the skin and subcutaneous tissue were identified and  cauterized.  The FCR flexor sheath was opened and the FCR was retracted  ulnarly.  We then found the fascia underneath the FCR, opened this, as well,  and  immediately dropped down onto the fracture site which had a major distal  fragment that did not appear to be intra-articular.  On the radial side,  there was a bit of comminution with a large butterfly type fragment that was  easily reducible with traction and a little bit of volar flexion of the  wrist.  This wound was also thoroughly irrigated out with normal saline  solution and a combination of Weitlaner, baby Rogelia Rohrer, and baby Colver  retractors were used to give a near anatomic reduction.  We then laid a four  hole major, eight hole distal, DVR plate left on the volar aspect of the  radius.  It fit nicely and the glide hole was then drilled and fixed with a  3.5 by 20 mm screw.  Under C-arm image control, we then checked the position  of the plate, adjusting the glide on the glide hole, and then cinching it  down.  We then fixed the major distal fragment with three distal row screws  and three proximal row screws using the fully threaded locking screws on all  but two of the holes and on two of them, we used partially threaded locking  screws.  Excellent fixation was obtained  and confirmed with C-arm imaging.  The major butterfly fragment actually keyed in nicely and was quite stable  after the distal screws had been placed.  Once again, the wound was  irrigated out with normal saline solution.  The tourniquet was let down,  small bleeders were identified and cauterized.  The subcutaneous tissue was  closed with 3-0 Vicryl suture and the skin loosely with 4-0 running nylon  suture.  A dressing of Xeroform, 4 by 4 dressing sponges, Webril, and an Ace  wrap and a splint were applied.  The patient was then awakened and taken to  the recovery room without difficulty.                                               Feliberto Gottron. Turner Daniels, M.D.    Ovid Curd  D:  07/20/2004  T:  07/20/2004  Job:  [id number]   cc:   Molly Maduro L. Foy [place], M.D.  [address] [place]  [place] [address]  Fax: [phone]

## 2011-04-09 NOTE — Consult Note (Signed)
   NAME:  ALAIJAH, GIBLER NO.:  [id number]   MEDICAL RECORD NO.:  [id number]                   PATIENT TYPE:  INP   LOCATION:  1823                                 FACILITY:  [facility]   PHYSICIAN:  Shan Levans, M.D. [facility]            DATE OF BIRTH:  Nov 14, 1931   DATE OF CONSULTATION:  08/30/2003  DATE OF DISCHARGE:                                   CONSULTATION   REFERRING PHYSICIAN:  Geoffry Paradise, M.D.   CHIEF COMPLAINT:  Evaluate pulmonary embolism.   HISTORY OF PRESENT ILLNESS:  This is a 77 year old white female who has had  a three-day history of increasing weakness, shortness of breath, and  malaise.  Found to have acute pulmonary embolism on CT scan here in the  emergency department.  She has been on long-term Premarin therapy.  Also a  history of hypertension, palpitations, and tremors.  No history of MI or  other major cardiopulmonary history.  No history of hypercoagulable state.  The patient is seen in the emergency room now with need for admission.  Asked by internal medicine to assist in the patient's care.   PAST MEDICAL HISTORY:  1. History of hypertension.  2. Tremors.   MEDICATIONS PRIOR TO ADMISSION:  1. Inderal 40 mg b.i.d.  2. Premarin daily.  3. Aspirin daily.   ALLERGIES:  None.   PAST OPERATIVE HISTORY:  Noncontributory.   FAMILY HISTORY:  Noncontributory.   SOCIAL HISTORY:  Nonsmoker.   REVIEW OF SYSTEMS:  Noncontributory.   PHYSICAL EXAMINATION:  VITAL SIGNS:  Blood pressure 140/98, pulse 120 in  sinus tachycardia, saturation 92% on room air, temperature 98 degrees.  GENERAL APPEARANCE:  This is a well-developed, well-nourished, white female  in no acute distress.  CHEST:  Clear bilaterally to auscultation and percussion.  There is no  evidence of wheeze or rhonchi.  CARDIAC:  Regular rate and rhythm with S3.  Normal S1 and S2.  ABDOMEN:  Soft and nontender.  EXTREMITIES:  No edema or clubbing.  NEUROLOGIC:   Intact.  HEENT:  No jugular venous distention.  No lymphadenopathy.   LABORATORY DATA:  CT scan of the chest showed bilateral pulmonary emboli.   IMPRESSION:  Bilateral pulmonary emboli in a patient with long-term Premarin  usage.   RECOMMENDATIONS:  Agree with IV heparin.  Would start Coumadin within 24  hours.  Agree with admission to stepdown unit.  Will follow with you.                                               Shan Levans, M.D. [facility]    PW/MEDQ  D:  08/30/2003  T:  08/31/2003  Job:  [id number]

## 2011-04-09 NOTE — Op Note (Signed)
NAME:  JINA, OLENICK NO.:  [id number]   MEDICAL RECORD NO.:  [id number]                   PATIENT TYPE:  OBV   LOCATION:  2861                                 FACILITY:  [facility]   PHYSICIAN:  Feliberto Gottron. Turner Daniels, M.D.                DATE OF BIRTH:  10-02-1931   DATE OF PROCEDURE:  07/20/2004  DATE OF DISCHARGE:                                 OPERATIVE REPORT   No dictation for this job number.                                               Feliberto Gottron. Turner Daniels, M.D.    Ovid Curd  D:  07/20/2004  T:  07/20/2004  Job:  [id number]

## 2011-04-09 NOTE — Op Note (Signed)
NAMECAMDEN, [name]          ACCOUNT NO.:  [id number]   MEDICAL RECORD NO.:  [id number]          PATIENT TYPE:  AMB   LOCATION:  DSC                          FACILITY:  [facility]   PHYSICIAN:  Feliberto Gottron. Turner Daniels, M.D.   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   POSTOPERATIVE DIAGNOSES:  1. Lateral meniscal tear and chondromalacia of the left knee.  2. 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   PROCEDURE:  Left knee arthroscopic partial lateral meniscectomy, debridement  of grade III chondromalacia from the medial femoral condyle, lateral femoral  condyle, trochlea and patella focal and chondromalacia of the lateral tibial  condyle, global grade III focal grade IV.   SURGEON:  Feliberto Gottron. Turner Daniels, M.D.   ANESTHESIA:  General LMA with local anesthetic.   ESTIMATED BLOOD LOSS:  Minimal.   FLUID REPLACEMENT:  700 mL crystalloid.   DRAINS PLACED:  None.   TOURNIQUET TIME:  None.   INDICATIONS FOR PROCEDURE:  The patient is a 76 year old woman who has been  followed in my office for arthritic changes of her left knee with a valgus  deformity.  She still has a fairly decent cartilage layer on x-ray with  [facility] grade II to grade III changes and has medial and lateral joint  line pain consistent with meniscal tears or possible loose bodies.  She has  failed conservative treatment and desires elective arthroscopic evaluation  and treatment of her left knee and understands at some point she may  progress to the need for a total knee arthroplasty but this is a relatively  minor lower risk procedure versus the total knee arthroplasty which would  carry more risk and more time and effort on her part.  Risks and benefits of  surgery discussed and she is  prepared for surgical stabilization.   DESCRIPTION OF PROCEDURE:  The patient identified by armband, taken to the  operating room at [facility]. [facility].  Appropriate anesthetic monitors were attached and general LMA anesthesia  induced with the patient supine position.  Lateral post applied to the table  and left lower extremity prepped and draped in the usual sterile fashion  from the ankle to the mid thigh.  We then infiltrated the inferomedial and  inferolateral peripatellar regions with 5-10 mL of 0.50% Marcaine and  epinephrine solution and made standard inferomedial and inferolateral  peripatellar portals allowing introduction of the arthroscope through the  inferolateral portal and the outflow through the inferomedial portal.  Pump  pressure was set between 40 and 80 mmHg.  Diagnostic arthroscopy revealed  grade III chondromalacia of the patella and trochlea focal to the apex of  the patella and the center of the trochlea.  This was debrided back to a  stable margin with a 3.5 gator sucker shaver.  Moving into the medial  compartment, the patient  had focal grade III chondromalacia to the distal  and posterior aspects of the medial femoral condyle and this was debrided as  well as a little bit of incidental debridement of the leading edge of the  medial meniscus.  The ACL and PCL were intact on the lateral side.  The  patient had focal chondromalacia of the lateral femoral condyle grade III  and pretty much global grade III and focal grade IV chondromalacia of the  lateral tibial plateau with flap tears and this was debrided as was  extensive degenerative tearing of the lateral meniscus.  The gutters were  cleared medially and laterally.  There were some small cartilaginous loose  bodies that were taken through the outflow and at this point, the  arthroscopic instruments were removed and a dressing of Xeroform 4x4  dressing sponges, Webril and Ace wrap  applied.  The patient was then  awakened and taken to the recovery room without difficulty.      Feliberto Gottron. Turner Daniels, M.D.  Electronically Signed     FJR/MEDQ  D:  08/17/2006  T:  08/19/2006  Job:  [id number]

## 2011-06-14 ENCOUNTER — Encounter (HOSPITAL_BASED_OUTPATIENT_CLINIC_OR_DEPARTMENT_OTHER): Payer: Medicare Other | Admitting: Hematology & Oncology

## 2011-06-14 ENCOUNTER — Other Ambulatory Visit: Payer: Self-pay | Admitting: Hematology & Oncology

## 2011-06-14 LAB — CBC WITH DIFFERENTIAL (CANCER CENTER ONLY)
MCH: 34.3 pg — ABNORMAL HIGH (ref 26.0–34.0)
MCV: 94 fL (ref 81–101)
Platelets: 247 10*3/uL (ref 145–400)
RBC: 4.55 10*6/uL (ref 3.70–5.32)
RDW: 12.4 % (ref 11.1–15.7)
WBC: 11.3 10*3/uL — ABNORMAL HIGH (ref 3.9–10.0)

## 2011-06-14 LAB — COMPREHENSIVE METABOLIC PANEL
AST: 13 U/L (ref 0–37)
Alkaline Phosphatase: 98 U/L (ref 39–117)
BUN: 29 mg/dL — ABNORMAL HIGH (ref 6–23)
CO2: 19 mEq/L (ref 19–32)
Calcium: 9.1 mg/dL (ref 8.4–10.5)
Chloride: 104 mEq/L (ref 96–112)
Creatinine, Ser: 0.81 mg/dL (ref 0.50–1.10)
Potassium: 4.5 mEq/L (ref 3.5–5.3)
Sodium: 137 mEq/L (ref 135–145)

## 2011-06-14 LAB — IRON AND TIBC
%SAT: 37 % (ref 20–55)
TIBC: 345 ug/dL (ref 250–470)
UIBC: 218 ug/dL

## 2011-06-14 LAB — MANUAL DIFFERENTIAL (CHCC SATELLITE)
ALC: 3.7 10*3/uL — ABNORMAL HIGH (ref 0.6–2.2)
ANC (CHCC HP manual diff): 6.9 10*3/uL — ABNORMAL HIGH (ref 1.5–6.7)
SEG: 61 % (ref 40–75)

## 2011-06-14 LAB — AFP TUMOR MARKER: AFP-Tumor Marker: 3.4 ng/mL (ref 0.0–8.0)

## 2011-07-29 ENCOUNTER — Other Ambulatory Visit (HOSPITAL_COMMUNITY): Payer: Self-pay | Admitting: Obstetrics and Gynecology

## 2011-07-29 DIAGNOSIS — Z1231 Encounter for screening mammogram for malignant neoplasm of breast: Secondary | ICD-10-CM

## 2011-08-20 ENCOUNTER — Ambulatory Visit (HOSPITAL_COMMUNITY): Payer: Medicare Other

## 2011-08-26 ENCOUNTER — Ambulatory Visit (HOSPITAL_COMMUNITY): Payer: Medicare Other

## 2011-09-15 ENCOUNTER — Ambulatory Visit (HOSPITAL_BASED_OUTPATIENT_CLINIC_OR_DEPARTMENT_OTHER)
Admission: RE | Admit: 2011-09-15 | Discharge: 2011-09-15 | Disposition: A | Discharge status: Home / Self Care | Payer: Medicare Other | Source: Ambulatory Visit | Attending: Obstetrics and Gynecology | Admitting: Obstetrics and Gynecology

## 2011-09-15 ENCOUNTER — Inpatient Hospital Stay (HOSPITAL_BASED_OUTPATIENT_CLINIC_OR_DEPARTMENT_OTHER): Admission: RE | Admit: 2011-09-15 | Payer: Medicare Other | Source: Ambulatory Visit

## 2011-09-15 DIAGNOSIS — Z1231 Encounter for screening mammogram for malignant neoplasm of breast: Secondary | ICD-10-CM | POA: Insufficient documentation

## 2011-12-06 ENCOUNTER — Ambulatory Visit: Payer: Medicare Other | Admitting: Hematology & Oncology

## 2011-12-06 ENCOUNTER — Other Ambulatory Visit: Payer: Medicare Other | Admitting: Lab

## 2011-12-23 ENCOUNTER — Telehealth: Payer: Self-pay | Admitting: Hematology & Oncology

## 2011-12-23 NOTE — Telephone Encounter (Signed)
Pt made 2-22 appointment from missed 1-14

## 2012-01-14 ENCOUNTER — Other Ambulatory Visit (HOSPITAL_BASED_OUTPATIENT_CLINIC_OR_DEPARTMENT_OTHER): Payer: Medicare Other | Admitting: Lab

## 2012-01-14 ENCOUNTER — Ambulatory Visit (HOSPITAL_BASED_OUTPATIENT_CLINIC_OR_DEPARTMENT_OTHER): Payer: Medicare Other | Admitting: Hematology & Oncology

## 2012-01-14 ENCOUNTER — Encounter: Payer: Self-pay | Admitting: Hematology & Oncology

## 2012-01-14 HISTORY — DX: Hereditary hemochromatosis: E83.110

## 2012-01-14 LAB — CBC WITH DIFFERENTIAL (CANCER CENTER ONLY)
BASO%: 0.3 % (ref 0.0–2.0)
EOS%: 1.1 % (ref 0.0–7.0)
LYMPH#: 2.8 10*3/uL (ref 0.9–3.3)
MCHC: 35.3 g/dL (ref 32.0–36.0)
MONO#: 0.9 10*3/uL (ref 0.1–0.9)
NEUT#: 9.8 10*3/uL — ABNORMAL HIGH (ref 1.5–6.5)
Platelets: 270 10*3/uL (ref 145–400)
RDW: 12.5 % (ref 11.1–15.7)
WBC: 13.7 10*3/uL — ABNORMAL HIGH (ref 3.9–10.0)

## 2012-01-14 LAB — RETICULOCYTES (CHCC)
RBC.: 3.98 MIL/uL (ref 3.87–5.11)
Retic Ct Pct: 1.6 % (ref 0.4–2.3)

## 2012-01-14 LAB — IRON AND TIBC
%SAT: 35 % (ref 20–55)
TIBC: 332 ug/dL (ref 250–470)
UIBC: 217 ug/dL (ref 125–400)

## 2012-01-14 LAB — HEPATIC FUNCTION PANEL
Alkaline Phosphatase: 95 U/L (ref 39–117)
Bilirubin, Direct: 0.1 mg/dL (ref 0.0–0.3)
Indirect Bilirubin: 0.6 mg/dL (ref 0.0–0.9)
Total Protein: 6.3 g/dL (ref 6.0–8.3)

## 2012-01-14 LAB — CHCC SATELLITE - SMEAR

## 2012-01-14 LAB — FERRITIN: Ferritin: 54 ng/mL (ref 10–291)

## 2012-01-14 NOTE — Progress Notes (Signed)
This office note has been dictated.

## 2012-01-15 NOTE — Progress Notes (Signed)
CC:   Geoffry Paradise, M.D.  DIAGNOSIS:  Hemochromatosis (C2A2T mutation, heterozygote).  CURRENT THERAPY:  Phlebotomy to maintain ferritin less than 100.  INTERIM HISTORY:  Ms. Zuidema comes in for followup.  She is doing well.  She is still recovering from her knee surgeries.  She had 1 knee surgery last year and the other the year before.  She has some balance issues.  She has not had any problems with fatigue or weakness.  She has had no change in her medications.  There has been no change in bowel or bladder habits.  When we last saw her in July of 2012, her ferritin was only 46.  She has had no cough.  She has had no fever.  She has had no bony pain. She has had no double vision or blurred vision.  She has had some swelling down her lower legs.  She noticed this after she had her knee surgeries, and this has been chronic.  PHYSICAL EXAMINATION:  This is a well-developed, well-nourished white female in no obvious distress.  Vital signs:  Temperature 97.8, pulse 68, respiratory rate 18, blood pressure 121/66.  Weight is 189.  Head and neck:  Shows a normocephalic, atraumatic skull.  There are no ocular or oral lesions.  There are no palpable cervical or supraclavicular lymph nodes.  Lungs:  Clear bilaterally.  Cardiac:  Regular rate and rhythm with a normal S1, S2.  There are no murmurs, rubs or bruits. Abdomen:  Soft with good bowel sounds.  There is no palpable abdominal mass.  There is no fluid wave.  There is no palpable hepatosplenomegaly. Back:  No tenderness over the spine, ribs, or hips.  Extremities:  Show no clubbing, cyanosis or edema.  Neurologic:  Shows no focal neurological deficits.  LABORATORY STUDIES:  White cell count is 13.7, hemoglobin 15.6, hematocrit 44.2, platelet count 270.  MCV is 98.  IMPRESSION:  Ms. Barbato is a 78 year old white female with hemochromatosis.  She is a heterozygote for the major mutation.  We have not phlebotomized her for  several years.  When we last saw her, we did check her alpha-fetoprotein.  This was only 3.4.  Her iron saturation was only 37%.  We will go ahead and plan to get her back in 6 more months.  I do not see that we need any blood work in between visits.    ______________________________ Josph Macho, M.D. PRE/MEDQ  D:  01/14/2012  T:  01/14/2012  Job:  [id number]

## 2012-08-03 ENCOUNTER — Other Ambulatory Visit (HOSPITAL_BASED_OUTPATIENT_CLINIC_OR_DEPARTMENT_OTHER): Payer: Medicare Other | Admitting: Lab

## 2012-08-03 ENCOUNTER — Ambulatory Visit (HOSPITAL_BASED_OUTPATIENT_CLINIC_OR_DEPARTMENT_OTHER): Payer: Medicare Other | Admitting: Hematology & Oncology

## 2012-08-03 LAB — CBC WITH DIFFERENTIAL (CANCER CENTER ONLY)
BASO%: 0.5 % (ref 0.0–2.0)
EOS%: 2.2 % (ref 0.0–7.0)
Eosinophils Absolute: 0.2 10*3/uL (ref 0.0–0.5)
LYMPH%: 30.6 % (ref 14.0–48.0)
MCH: 34.2 pg — ABNORMAL HIGH (ref 26.0–34.0)
MCHC: 35.1 g/dL (ref 32.0–36.0)
MCV: 97 fL (ref 81–101)
MONO%: 8 % (ref 0.0–13.0)
NEUT#: 6 10*3/uL (ref 1.5–6.5)
Platelets: 246 10*3/uL (ref 145–400)
RDW: 12.5 % (ref 11.1–15.7)

## 2012-08-03 LAB — FERRITIN: Ferritin: 64 ng/mL (ref 10–291)

## 2012-08-03 LAB — IRON AND TIBC: Iron: 98 ug/dL (ref 42–145)

## 2012-08-03 NOTE — Patient Instructions (Signed)
.  cal if problems

## 2012-08-03 NOTE — Progress Notes (Signed)
This office note has been dictated.

## 2012-08-04 ENCOUNTER — Telehealth: Payer: Self-pay | Admitting: *Deleted

## 2012-08-04 NOTE — Progress Notes (Signed)
CC:   Geoffry Paradise, M.D.  DIAGNOSIS:  Hemochromatosis (C282Ymutation, heterozygote).  CURRENT THERAPY:  Phlebotomy to maintain ferritin less than 100.  INTERIM HISTORY:  [name] comes in for her followup.  She is doing okay.  Her son-in-law passed away this summer.  I took care of him.  He had metastatic prostate cancer.  This been tough on the family. Thankfully, they are a strong family.  When we last saw her, her ferritin was 54.  We have not had to phlebotomize her, I think since I have been seeing her.  She is having some issues with her knees.  She has had knee surgery over the past couple years.  She still has some difficulties with getting around.  She has had no change in bowel or bladder habits.  She has had no nausea or vomiting.  There have been no headaches.  She has had no rashes.  PHYSICAL EXAMINATION:  This is a well-developed, well-nourished white female in no obvious distress.  Vital signs:  Temperature of 97.6, pulse 62, respiratory rate 18, blood pressure 135/55.  Weight is 188.  Head and neck:  Normocephalic, atraumatic skull.  There are no ocular or oral lesions.  There are no palpable cervical or supraclavicular lymph nodes. Lungs:  Clear bilaterally.  Cardiac:  Regular rate and rhythm with a normal S1 and S2.  There are no murmurs, rubs or bruits.  Abdomen:  Soft with good bowel sounds.  There is no palpable abdominal mass.  There is no fluid wave.  There is no palpable hepatosplenomegaly.  Back:  No tenderness over the spine, ribs, or hips.  Extremities:  Bilateral knee replacement scars.  There is some swelling about her knees.  There is some slight swelling in her lower legs, which is chronic.  Neurological: No focal neurological deficits.  LABORATORY STUDIES:  White cell count 10.3, hemoglobin 15.3, hematocrit 43.6, platelet count is 246.  IMPRESSION:  [name] is a 78 year old female with hemochromatosis. She is doing well with this.   I wish she was doing as well with her other health issues.  We will see what her ferritin is.  Again, we have not had to phlebotomize her since we have seen her.  We will plan to have her come back in 3 months just for a blood checked. I will see her back myself in 6 months.    ______________________________ [name], M.D. PRE/MEDQ  D:  08/03/2012  T:  08/04/2012  Job:  [id number]

## 2012-08-04 NOTE — Telephone Encounter (Addendum)
Message copied by Wynonia Hazard on Fri Aug 04, 2012  2:54 PM ------      Message from: Arlan Organ R      Created: Thu Aug 03, 2012  8:40 PM       Call - iron still good. NO need for phlebotomy.  Pete  Left message on voicemail with the above message. To call back if any questions.

## 2012-08-18 ENCOUNTER — Other Ambulatory Visit (HOSPITAL_COMMUNITY): Payer: Self-pay | Admitting: Orthopedic Surgery

## 2012-08-18 DIAGNOSIS — M25562 Pain in left knee: Secondary | ICD-10-CM

## 2012-08-25 ENCOUNTER — Encounter (HOSPITAL_COMMUNITY)
Admission: RE | Admit: 2012-08-25 | Discharge: 2012-08-25 | Disposition: A | Discharge status: Home / Self Care | Payer: Medicare Other | Source: Ambulatory Visit | Attending: Orthopedic Surgery | Admitting: Orthopedic Surgery

## 2012-08-25 DIAGNOSIS — M25562 Pain in left knee: Secondary | ICD-10-CM

## 2012-08-25 DIAGNOSIS — M25569 Pain in unspecified knee: Secondary | ICD-10-CM | POA: Insufficient documentation

## 2012-08-25 MED ORDER — TECHNETIUM TC 99M MEDRONATE IV KIT
27.0000 | PACK | Freq: Once | INTRAVENOUS | Status: AC | PRN
  Administered 2012-08-25: 27 via INTRAVENOUS

## 2012-10-16 ENCOUNTER — Other Ambulatory Visit (HOSPITAL_BASED_OUTPATIENT_CLINIC_OR_DEPARTMENT_OTHER): Payer: Self-pay | Admitting: Obstetrics and Gynecology

## 2012-10-16 DIAGNOSIS — Z1231 Encounter for screening mammogram for malignant neoplasm of breast: Secondary | ICD-10-CM

## 2012-10-27 ENCOUNTER — Ambulatory Visit (HOSPITAL_BASED_OUTPATIENT_CLINIC_OR_DEPARTMENT_OTHER): Payer: Medicare Other

## 2012-11-02 ENCOUNTER — Other Ambulatory Visit: Payer: Medicare Other | Admitting: Lab

## 2012-11-02 ENCOUNTER — Ambulatory Visit (HOSPITAL_BASED_OUTPATIENT_CLINIC_OR_DEPARTMENT_OTHER)
Admission: RE | Admit: 2012-11-02 | Discharge: 2012-11-02 | Disposition: A | Discharge status: Home / Self Care | Payer: Medicare Other | Source: Ambulatory Visit | Attending: Obstetrics and Gynecology | Admitting: Obstetrics and Gynecology

## 2012-11-02 ENCOUNTER — Other Ambulatory Visit (HOSPITAL_BASED_OUTPATIENT_CLINIC_OR_DEPARTMENT_OTHER): Payer: Medicare Other | Admitting: Lab

## 2012-11-02 DIAGNOSIS — Z1231 Encounter for screening mammogram for malignant neoplasm of breast: Secondary | ICD-10-CM | POA: Insufficient documentation

## 2012-11-02 LAB — CBC WITH DIFFERENTIAL (CANCER CENTER ONLY)
BASO#: 0.1 10*3/uL (ref 0.0–0.2)
Eosinophils Absolute: 0.3 10*3/uL (ref 0.0–0.5)
HCT: 42.9 % (ref 34.8–46.6)
HGB: 15 g/dL (ref 11.6–15.9)
LYMPH%: 28.4 % (ref 14.0–48.0)
MCH: 34.2 pg — ABNORMAL HIGH (ref 26.0–34.0)
MCV: 98 fL (ref 81–101)
MONO#: 1.1 10*3/uL — ABNORMAL HIGH (ref 0.1–0.9)
MONO%: 8.8 % (ref 0.0–13.0)
NEUT%: 60.2 % (ref 39.6–80.0)
RBC: 4.38 10*6/uL (ref 3.70–5.32)
WBC: 12.5 10*3/uL — ABNORMAL HIGH (ref 3.9–10.0)

## 2013-01-31 ENCOUNTER — Other Ambulatory Visit: Payer: Medicare Other | Admitting: Lab

## 2013-01-31 ENCOUNTER — Ambulatory Visit: Payer: Medicare Other | Admitting: Hematology & Oncology

## 2013-01-31 ENCOUNTER — Ambulatory Visit: Payer: Medicare Other | Admitting: Medical

## 2013-02-01 ENCOUNTER — Telehealth: Payer: Self-pay | Admitting: Hematology & Oncology

## 2013-02-01 NOTE — Telephone Encounter (Signed)
Pt scheduled 3/27

## 2013-02-01 NOTE — Telephone Encounter (Signed)
Left pt message to call and reschedule missed appointments

## 2013-02-08 ENCOUNTER — Other Ambulatory Visit: Payer: Medicare Other | Admitting: Lab

## 2013-02-08 ENCOUNTER — Ambulatory Visit: Payer: Medicare Other | Admitting: Medical

## 2013-02-15 ENCOUNTER — Ambulatory Visit (HOSPITAL_BASED_OUTPATIENT_CLINIC_OR_DEPARTMENT_OTHER): Payer: Medicare Other | Admitting: Medical

## 2013-02-15 ENCOUNTER — Other Ambulatory Visit (HOSPITAL_BASED_OUTPATIENT_CLINIC_OR_DEPARTMENT_OTHER): Payer: Medicare Other | Admitting: Lab

## 2013-02-15 LAB — CBC WITH DIFFERENTIAL (CANCER CENTER ONLY)
BASO%: 0.4 % (ref 0.0–2.0)
Eosinophils Absolute: 0.2 10*3/uL (ref 0.0–0.5)
HCT: 44.4 % (ref 34.8–46.6)
LYMPH%: 35.6 % (ref 14.0–48.0)
MCH: 33.8 pg (ref 26.0–34.0)
MCV: 97 fL (ref 81–101)
MONO#: 0.9 10*3/uL (ref 0.1–0.9)
MONO%: 8.3 % (ref 0.0–13.0)
NEUT%: 53.9 % (ref 39.6–80.0)
Platelets: 224 10*3/uL (ref 145–400)
RDW: 12.7 % (ref 11.1–15.7)

## 2013-02-15 LAB — IRON AND TIBC
%SAT: 28 % (ref 20–55)
TIBC: 327 ug/dL (ref 250–470)

## 2013-02-15 LAB — FERRITIN: Ferritin: 61 ng/mL (ref 10–291)

## 2013-02-15 NOTE — Progress Notes (Signed)
DIAGNOSIS:  Hemochromatosis (C282Ymutation, heterozygote).  CURRENT THERAPY:  Phlebotomy to maintain ferritin less than 100.  INTERIM HISTORY:  [name] presents today for an office followup visit.  Overall, [name], reports, that [name]'s been doing relatively well.  Back in September her ferritin was 64.  We like to maintain her ferritin less than 100.  We have not had phlebotomize her, since we've been seeing her.  [name] still continues to have some chronic issues with her knees.  [name] does have some difficulty getting around.  [name] otherwise does not report any new problems or medications.  [name] has a good appetite.  [name] denies any nausea, vomiting, diarrhea, constipation, chest pain, shortness of breath, or cough.  [name] denies any fevers, chills, or night sweats.  [name] denies any type of abdominal pain.  [name] denies any lower leg swelling.  [name] denies any obvious, or abnormal bleeding.  [name] denies any headaches, visual changes, or rashes.  Review of Systems: Constitutional:Negative for malaise/fatigue, fever, chills, weight loss, diaphoresis, activity change, appetite change, and unexpected weight change.  HEENT: Negative for double vision, blurred vision, visual loss, ear pain, tinnitus, congestion, rhinorrhea, epistaxis sore throat or sinus disease, oral pain/lesion, tongue soreness Respiratory: Negative for cough, chest tightness, shortness of breath, wheezing and stridor.  Cardiovascular: Negative for chest pain, palpitations, leg swelling, orthopnea, PND, DOE or claudication Gastrointestinal: Negative for nausea, vomiting, abdominal pain, diarrhea, constipation, blood in stool, melena, hematochezia, abdominal distention, anal bleeding, rectal pain, anorexia and hematemesis.  Genitourinary: Negative for dysuria, frequency, hematuria,  Musculoskeletal: Negative for myalgias, back pain, joint swelling, arthralgias and gait problem.  Skin: Negative for rash, color change, pallor and wound.   Neurological:. Negative for dizziness/light-headedness, tremors, seizures, syncope, facial asymmetry, speech difficulty, weakness, numbness, headaches and paresthesias.  Hematological: Negative for adenopathy. Does not bruise/bleed easily.  Psychiatric/Behavioral:  Negative for depression, no loss of interest in normal activity or change in sleep pattern.   Physical Exam: This is a pleasant, 79 year old, well-developed, on March, white female, in no obvious distress Vitals: Temperature 97.8 degrees, pulse 53, respirations 16, blood pressure 141/56, weight 184 pounds HEENT reveals a normocephalic, atraumatic skull, no scleral icterus, no oral lesions  Neck is supple without any cervical or supraclavicular adenopathy.  Lungs are clear to auscultation bilaterally. There are no wheezes, rales or rhonci Cardiac is regular rate and rhythm with a normal S1 and S2. There are no murmurs, rubs, or bruits.  Abdomen is soft with good bowel sounds, there is no palpable mass. There is no palpable hepatosplenomegaly. There is no palpable fluid wave.  Musculoskeletal no tenderness of the spine, ribs, or hips.  Extremities there are no clubbing, cyanosis, or edema.  Skin no petechia, purpura or ecchymosis Neurologic is nonfocal.  Laboratory Data: White count 10.5, hemoglobin 15.5, hematocrit 44.4, platelets 224,000 Last ferritin level on 08/03/2012 was 64  Current Outpatient Prescriptions on File Prior to Visit  Medication Sig Dispense Refill  . propranolol (INDERAL) 10 MG tablet Take by mouth 2 (two) times daily.       No current facility-administered medications on file prior to visit.   Assessment/Plan: This is a pleasant, 79 year old, white female, with the following issues:  #1.  Hemochromatosis.  Overall, [name] is doing well with this.  We have not had to phlebotomize her as of yet.  We like to keep her ferritin below 100.  We will continue to monitor this.  #2.  Followup.  We will have her come  back in 3  months for lab only, in 6 months, for an office followup visit.  We will see her before then should there be questions or concerns.

## 2013-02-19 ENCOUNTER — Telehealth: Payer: Self-pay | Admitting: *Deleted

## 2013-02-19 NOTE — Telephone Encounter (Signed)
Message copied by Anselm Jungling on Mon Feb 19, 2013  9:59 AM ------      Message from: Josph Macho      Created: Sun Feb 18, 2013  3:46 PM       Call - iron levels still ok!!  No need to phlebotomize!!!  Pete ------

## 2013-02-19 NOTE — Telephone Encounter (Signed)
Patient called asking for her labs results .  Gave her Dr. Tama Gander message that iron levels were ok no need to phlebotomize

## 2013-02-28 ENCOUNTER — Other Ambulatory Visit: Payer: Self-pay

## 2013-02-28 MED ORDER — PROPRANOLOL HCL ER 60 MG PO CP24: 60.0000 mg | ORAL_CAPSULE | Freq: Two times a day (BID) | ORAL | Status: DC

## 2013-03-12 ENCOUNTER — Other Ambulatory Visit: Payer: Self-pay | Admitting: Neurology

## 2013-05-11 ENCOUNTER — Other Ambulatory Visit (HOSPITAL_BASED_OUTPATIENT_CLINIC_OR_DEPARTMENT_OTHER): Payer: Medicare Other | Admitting: Lab

## 2013-05-11 LAB — CBC WITH DIFFERENTIAL (CANCER CENTER ONLY)
BASO#: 0.1 10*3/uL (ref 0.0–0.2)
Eosinophils Absolute: 0.2 10*3/uL (ref 0.0–0.5)
HCT: 42.5 % (ref 34.8–46.6)
HGB: 14.9 g/dL (ref 11.6–15.9)
LYMPH#: 3 10*3/uL (ref 0.9–3.3)
MCHC: 35.1 g/dL (ref 32.0–36.0)
NEUT#: 5.6 10*3/uL (ref 1.5–6.5)
NEUT%: 57.4 % (ref 39.6–80.0)
RBC: 4.24 10*6/uL (ref 3.70–5.32)

## 2013-05-14 ENCOUNTER — Telehealth: Payer: Self-pay | Admitting: *Deleted

## 2013-05-14 ENCOUNTER — Telehealth: Payer: Self-pay | Admitting: Hematology & Oncology

## 2013-05-14 ENCOUNTER — Other Ambulatory Visit: Payer: Self-pay | Admitting: *Deleted

## 2013-05-14 NOTE — Telephone Encounter (Signed)
Pt called wanting the results of her labs from last week. Ferritin 99. Cut off for phlebotomy is 100 per office note. She stated that she would rather have a phlebotomy. Request made to scheduling.

## 2013-05-14 NOTE — Telephone Encounter (Signed)
Patient called and spoke with RN.  Per Amy to sch phlb apt.  Patient sch apt for 05/29/13.

## 2013-05-29 ENCOUNTER — Ambulatory Visit (HOSPITAL_BASED_OUTPATIENT_CLINIC_OR_DEPARTMENT_OTHER): Payer: Medicare Other

## 2013-05-29 NOTE — Patient Instructions (Signed)

## 2013-05-29 NOTE — Progress Notes (Signed)
Zack Seal presents today for phlebotomy per MD orders. Phlebotomy procedure started at 1320 and ended at 1328. Approximately 500 mls removed. Patient observed for 30 minutes after procedure without any incident. Patient tolerated procedure well. IV needle removed intact.

## 2013-07-25 ENCOUNTER — Ambulatory Visit: Payer: Self-pay | Admitting: Neurology

## 2013-07-30 ENCOUNTER — Telehealth: Payer: Self-pay | Admitting: Hematology & Oncology

## 2013-07-30 NOTE — Telephone Encounter (Signed)
Pt aware of 9-26 time change

## 2013-08-17 ENCOUNTER — Ambulatory Visit: Payer: Medicare Other | Admitting: Hematology & Oncology

## 2013-08-17 ENCOUNTER — Other Ambulatory Visit: Payer: Medicare Other | Admitting: Lab

## 2013-08-28 ENCOUNTER — Telehealth: Payer: Self-pay | Admitting: Hematology & Oncology

## 2013-08-28 NOTE — Telephone Encounter (Signed)
Pt called wants appointment said would be gone until around thanksgiving. She scheduled 11-28 appointment

## 2013-09-01 ENCOUNTER — Other Ambulatory Visit: Payer: Self-pay | Admitting: Neurology

## 2013-10-19 ENCOUNTER — Other Ambulatory Visit (HOSPITAL_BASED_OUTPATIENT_CLINIC_OR_DEPARTMENT_OTHER): Payer: Medicare Other | Admitting: Lab

## 2013-10-19 ENCOUNTER — Ambulatory Visit (HOSPITAL_BASED_OUTPATIENT_CLINIC_OR_DEPARTMENT_OTHER): Payer: Medicare Other | Admitting: Hematology & Oncology

## 2013-10-19 LAB — FERRITIN CHCC: Ferritin: 206 ng/ml (ref 9–269)

## 2013-10-19 LAB — CBC WITH DIFFERENTIAL (CANCER CENTER ONLY)
BASO#: 0.1 10*3/uL (ref 0.0–0.2)
Eosinophils Absolute: 0.2 10*3/uL (ref 0.0–0.5)
HCT: 39.4 % (ref 34.8–46.6)
HGB: 13.5 g/dL (ref 11.6–15.9)
LYMPH%: 18.9 % (ref 14.0–48.0)
MCH: 33.1 pg (ref 26.0–34.0)
MCV: 97 fL (ref 81–101)
MONO#: 1.7 10*3/uL — ABNORMAL HIGH (ref 0.1–0.9)
MONO%: 15.4 % — ABNORMAL HIGH (ref 0.0–13.0)
NEUT%: 63.7 % (ref 39.6–80.0)
RBC: 4.08 10*6/uL (ref 3.70–5.32)
WBC: 11.2 10*3/uL — ABNORMAL HIGH (ref 3.9–10.0)

## 2013-10-19 LAB — IRON AND TIBC CHCC
%SAT: 21 % (ref 21–57)
UIBC: 235 ug/dL (ref 120–384)

## 2013-10-19 NOTE — Progress Notes (Signed)
This office note has been dictated.

## 2013-10-28 NOTE — Progress Notes (Signed)
CC:   Geoffry Paradise, M.D.  DIAGNOSIS:  Hemochromatosis (C282Y mutation-heterozygote).  CURRENT THERAPY:  Phlebotomy to maintain ferritin less than 100.  INTERIM HISTORY:  [name] comes in for her followup.  She is doing okay.  She did have, I think, bilateral knee surgery.  She is getting through this.  Her husband is having some health issues also.  He is a diabetic and he has had some big toes amputated.  She does have some dizziness.  Her blood pressure has been on the low side she says.  She is on Inderal.  We last phlebotomized her back in June when her ferritin was 99.  She has had no problems with cough or shortness breath.  She has had no change in bowel or bladder habits.  There has been no leg swelling.  She has had no increase in fatigue or weakness.  PHYSICAL EXAMINATION:  General:  This is a well-developed, well- nourished white female, in no obvious distress.  Vital Signs: Temperature of 97.8, pulse 84, respiratory rate 18, blood pressure 109/49.  Weight is 188 pounds.  Head and Neck Exam:  Shows a normocephalic, atraumatic skull.  There are no ocular or oral lesions. There are no palpable cervical or supraclavicular lymph nodes.  Lungs: Clear bilaterally.  Cardiac Exam:  Regular rate and rhythm with a normal S1 and S2.  There are no murmurs, rubs or bruits.  Abdomen:  Soft.  She has good bowel sounds.  There is no palpable abdominal mass.  There is no fluid wave.  There is no palpable hepatosplenomegaly.  Extremities: Show the surgeries with her knee replacements.  There is some decreased range of motion of her knees.  There is no swelling in her legs.  Skin Exam:  No rashes, ecchymosis, or petechia.  LABORATORY STUDIES:  White cell count is 11.2, hemoglobin 13.5, hematocrit 39.4, platelet count 196.  MCV is 97.  IMPRESSION:  [name] is a very charming 79 year old white female with hemochromatosis.  Again, she is heterozygous with a major  mutation.  We will see what her ferritin is.  One would think that her ferritin is not too high right now.  We have her come back every 3 months for lab work.  I will see her back myself in 6 months.   ______________________________ Josph Macho, M.D. PRE/MEDQ  D:  10/19/2013  T:  10/27/2013  Job:  [id number]

## 2013-10-30 ENCOUNTER — Telehealth: Payer: Self-pay | Admitting: Hematology & Oncology

## 2013-10-30 NOTE — Telephone Encounter (Signed)
Pt called said she needed a lab. She is aware of 12-10 appointment. Per MD she does need one.

## 2013-10-31 ENCOUNTER — Other Ambulatory Visit: Payer: Medicare Other | Admitting: Lab

## 2013-10-31 ENCOUNTER — Telehealth: Payer: Self-pay | Admitting: Hematology & Oncology

## 2013-10-31 NOTE — Telephone Encounter (Signed)
Pt cx 12-10 lab she is sick will call back to reschedule

## 2013-11-05 ENCOUNTER — Other Ambulatory Visit: Payer: Self-pay | Admitting: Hematology & Oncology

## 2013-11-05 DIAGNOSIS — R591 Generalized enlarged lymph nodes: Secondary | ICD-10-CM

## 2013-11-06 ENCOUNTER — Other Ambulatory Visit: Payer: Self-pay

## 2013-11-06 ENCOUNTER — Other Ambulatory Visit (HOSPITAL_BASED_OUTPATIENT_CLINIC_OR_DEPARTMENT_OTHER): Payer: Medicare Other | Admitting: Lab

## 2013-11-06 ENCOUNTER — Telehealth: Payer: Self-pay | Admitting: *Deleted

## 2013-11-06 ENCOUNTER — Ambulatory Visit (HOSPITAL_BASED_OUTPATIENT_CLINIC_OR_DEPARTMENT_OTHER): Payer: Medicare Other | Admitting: Hematology & Oncology

## 2013-11-06 ENCOUNTER — Inpatient Hospital Stay (HOSPITAL_COMMUNITY): Payer: Medicare Other

## 2013-11-06 ENCOUNTER — Encounter (HOSPITAL_COMMUNITY): Payer: Self-pay

## 2013-11-06 ENCOUNTER — Inpatient Hospital Stay (HOSPITAL_COMMUNITY)
Admission: AD | Admit: 2013-11-06 | Discharge: 2013-11-14 | DRG: 823 | Disposition: A | Discharge status: Home Health | Payer: Medicare Other | Source: Ambulatory Visit | Attending: Hematology & Oncology | Admitting: Hematology & Oncology

## 2013-11-06 VITALS — BP 116/51 | HR 81 | Temp 97.9°F | Resp 14 | Ht 63.0 in | Wt 179.0 lb

## 2013-11-06 DIAGNOSIS — M6281 Muscle weakness (generalized): Secondary | ICD-10-CM

## 2013-11-06 DIAGNOSIS — R339 Retention of urine, unspecified: Secondary | ICD-10-CM | POA: Diagnosis not present

## 2013-11-06 DIAGNOSIS — Z9849 Cataract extraction status, unspecified eye: Secondary | ICD-10-CM

## 2013-11-06 DIAGNOSIS — Z7982 Long term (current) use of aspirin: Secondary | ICD-10-CM

## 2013-11-06 DIAGNOSIS — I10 Essential (primary) hypertension: Secondary | ICD-10-CM | POA: Diagnosis present

## 2013-11-06 DIAGNOSIS — R61 Generalized hyperhidrosis: Secondary | ICD-10-CM | POA: Diagnosis not present

## 2013-11-06 DIAGNOSIS — R599 Enlarged lymph nodes, unspecified: Secondary | ICD-10-CM

## 2013-11-06 DIAGNOSIS — R5381 Other malaise: Secondary | ICD-10-CM | POA: Diagnosis present

## 2013-11-06 DIAGNOSIS — R05 Cough: Secondary | ICD-10-CM | POA: Diagnosis present

## 2013-11-06 DIAGNOSIS — C8588 Other specified types of non-Hodgkin lymphoma, lymph nodes of multiple sites: Principal | ICD-10-CM | POA: Diagnosis present

## 2013-11-06 DIAGNOSIS — R059 Cough, unspecified: Secondary | ICD-10-CM | POA: Diagnosis present

## 2013-11-06 DIAGNOSIS — Z86711 Personal history of pulmonary embolism: Secondary | ICD-10-CM

## 2013-11-06 DIAGNOSIS — C8590 Non-Hodgkin lymphoma, unspecified, unspecified site: Secondary | ICD-10-CM

## 2013-11-06 DIAGNOSIS — E440 Moderate protein-calorie malnutrition: Secondary | ICD-10-CM | POA: Insufficient documentation

## 2013-11-06 DIAGNOSIS — I959 Hypotension, unspecified: Secondary | ICD-10-CM | POA: Diagnosis not present

## 2013-11-06 DIAGNOSIS — R29898 Other symptoms and signs involving the musculoskeletal system: Secondary | ICD-10-CM

## 2013-11-06 DIAGNOSIS — I2699 Other pulmonary embolism without acute cor pulmonale: Secondary | ICD-10-CM | POA: Diagnosis present

## 2013-11-06 DIAGNOSIS — R197 Diarrhea, unspecified: Secondary | ICD-10-CM | POA: Diagnosis present

## 2013-11-06 DIAGNOSIS — R591 Generalized enlarged lymph nodes: Secondary | ICD-10-CM

## 2013-11-06 DIAGNOSIS — I824Z9 Acute embolism and thrombosis of unspecified deep veins of unspecified distal lower extremity: Secondary | ICD-10-CM | POA: Diagnosis present

## 2013-11-06 DIAGNOSIS — Z96659 Presence of unspecified artificial knee joint: Secondary | ICD-10-CM

## 2013-11-06 DIAGNOSIS — E785 Hyperlipidemia, unspecified: Secondary | ICD-10-CM | POA: Diagnosis present

## 2013-11-06 DIAGNOSIS — T451X5A Adverse effect of antineoplastic and immunosuppressive drugs, initial encounter: Secondary | ICD-10-CM | POA: Diagnosis not present

## 2013-11-06 HISTORY — DX: Nausea with vomiting, unspecified: R11.2

## 2013-11-06 HISTORY — DX: Tremor, unspecified: R25.1

## 2013-11-06 HISTORY — DX: Other specified postprocedural states: Z98.890

## 2013-11-06 LAB — CBC WITH DIFFERENTIAL (CANCER CENTER ONLY)
BASO%: 0.4 % (ref 0.0–2.0)
HCT: 36.6 % (ref 34.8–46.6)
LYMPH%: 10.6 % — ABNORMAL LOW (ref 14.0–48.0)
MCH: 35.9 pg — ABNORMAL HIGH (ref 26.0–34.0)
MCV: 100 fL (ref 81–101)
MONO#: 2.2 10*3/uL — ABNORMAL HIGH (ref 0.1–0.9)
MONO%: 13.1 % — ABNORMAL HIGH (ref 0.0–13.0)
NEUT%: 74.9 % (ref 39.6–80.0)
RDW: 16.7 % — ABNORMAL HIGH (ref 11.1–15.7)
WBC: 16.8 10*3/uL — ABNORMAL HIGH (ref 3.9–10.0)

## 2013-11-06 LAB — CBC
Hemoglobin: 12.5 g/dL (ref 12.0–15.0)
MCH: 39.6 pg — ABNORMAL HIGH (ref 26.0–34.0)
MCHC: 39.4 g/dL — ABNORMAL HIGH (ref 30.0–36.0)
RDW: 16.4 % — ABNORMAL HIGH (ref 11.5–15.5)

## 2013-11-06 LAB — CREATININE, SERUM
Creatinine, Ser: 0.73 mg/dL (ref 0.50–1.10)
GFR calc Af Amer: >90 mL/min (ref >90)

## 2013-11-06 LAB — IRON AND TIBC CHCC
Iron: 96 ug/dL (ref 41–142)
TIBC: 288 ug/dL (ref 236–444)
UIBC: 191 ug/dL (ref 120–384)

## 2013-11-06 MED ORDER — FOLIC ACID 1 MG PO TABS
1.0000 mg | ORAL_TABLET | Freq: Every day | ORAL | Status: DC
  Administered 2013-11-07 – 2013-11-09 (×3): 1 mg via ORAL
  Filled 2013-11-06 (×6): qty 1

## 2013-11-06 MED ORDER — ASPIRIN EC 81 MG PO TBEC
81.0000 mg | DELAYED_RELEASE_TABLET | Freq: Every day | ORAL | Status: DC
  Administered 2013-11-07 – 2013-11-14 (×8): 81 mg via ORAL
  Filled 2013-11-06 (×8): qty 1

## 2013-11-06 MED ORDER — ENOXAPARIN SODIUM 40 MG/0.4ML ~~LOC~~ SOLN
40.0000 mg | SUBCUTANEOUS | Status: DC
  Filled 2013-11-06: qty 0.4

## 2013-11-06 MED ORDER — LOPERAMIDE HCL 2 MG PO CAPS: 2.0000 mg | ORAL_CAPSULE | ORAL | Status: DC | PRN

## 2013-11-06 MED ORDER — ATORVASTATIN CALCIUM 20 MG PO TABS
20.0000 mg | ORAL_TABLET | Freq: Every day | ORAL | Status: DC
  Administered 2013-11-08 – 2013-11-14 (×4): 20 mg via ORAL
  Filled 2013-11-06 (×8): qty 1

## 2013-11-06 MED ORDER — ASPIRIN 81 MG PO TABS: 81.0000 mg | ORAL_TABLET | Freq: Every day | ORAL | Status: DC

## 2013-11-06 MED ORDER — ENOXAPARIN SODIUM 40 MG/0.4ML ~~LOC~~ SOLN
40.0000 mg | Freq: Once | SUBCUTANEOUS | Status: AC
  Administered 2013-11-06: 40 mg via SUBCUTANEOUS
  Filled 2013-11-06: qty 0.4

## 2013-11-06 MED ORDER — VITAMIN D (ERGOCALCIFEROL) 1.25 MG (50000 UNIT) PO CAPS
50000.0000 [IU] | ORAL_CAPSULE | ORAL | Status: DC
  Administered 2013-11-08 – 2013-11-12 (×2): 50000 [IU] via ORAL
  Filled 2013-11-06 (×4): qty 1

## 2013-11-06 MED ORDER — IOHEXOL 300 MG/ML  SOLN: 50.0000 mL | Freq: Once | INTRAMUSCULAR | Status: AC | PRN

## 2013-11-06 MED ORDER — SODIUM CHLORIDE 0.9 % IV SOLN
INTRAVENOUS | Status: DC
  Administered 2013-11-06: 50 mL/h via INTRAVENOUS
  Administered 2013-11-07 – 2013-11-12 (×5): via INTRAVENOUS
  Administered 2013-11-13: 50 mL/h via INTRAVENOUS

## 2013-11-06 MED ORDER — IOHEXOL 300 MG/ML  SOLN
100.0000 mL | Freq: Once | INTRAMUSCULAR | Status: AC | PRN
  Administered 2013-11-06: 100 mL via INTRAVENOUS

## 2013-11-06 MED ORDER — HYDROCODONE-ACETAMINOPHEN 5-325 MG PO TABS
1.0000 | ORAL_TABLET | Freq: Four times a day (QID) | ORAL | Status: DC | PRN
  Administered 2013-11-08 – 2013-11-10 (×4): 1 via ORAL
  Filled 2013-11-06 (×4): qty 1

## 2013-11-06 MED ORDER — PROPRANOLOL HCL ER 60 MG PO CP24
60.0000 mg | ORAL_CAPSULE | Freq: Every day | ORAL | Status: DC
  Administered 2013-11-07 – 2013-11-09 (×3): 60 mg via ORAL
  Filled 2013-11-06 (×3): qty 1

## 2013-11-06 MED ORDER — ENOXAPARIN SODIUM 80 MG/0.8ML ~~LOC~~ SOLN
80.0000 mg | Freq: Two times a day (BID) | SUBCUTANEOUS | Status: DC
  Administered 2013-11-06: 80 mg via SUBCUTANEOUS
  Filled 2013-11-06 (×3): qty 0.8

## 2013-11-06 NOTE — Plan of Care (Signed)
Problem: Phase I Progression Outcomes Goal: OOB as tolerated unless otherwise ordered Outcome: Progressing Patient's daughter who is an OT at [facility] got her OOB. Goal: Initial discharge plan identified Outcome: Progressing Patient OOB with daughter who is an OT at [facility]

## 2013-11-06 NOTE — Telephone Encounter (Signed)
Spoke to [employer] in admitting. The pt can be directly admitted to [facility] for weakness. To be worked up for lymphoma.

## 2013-11-06 NOTE — Progress Notes (Signed)
ANTICOAGULATION CONSULT NOTE - Initial Consult  Pharmacy Consult for Lovenox Indication: pulmonary embolus  No Known Allergies  Patient Measurements: Height: 5\' 3"  (160 cm) Weight: 179 lb 8.3 oz (81.43 kg) IBW/kg (Calculated) : 52.4  Vital Signs: Temp: 98 F (36.7 C) (12/16 2131) Temp src: Oral (12/16 2131) BP: 116/48 mmHg (12/16 2131) Pulse Rate: 90 (12/16 2131)  Labs:  Recent Labs  11/06/13 0945 11/06/13 1507  HGB 13.1 12.5  HCT 36.6 31.7*  PLT 189 131*  CREATININE 0.90 0.73    Estimated Creatinine Clearance: 56.7 ml/min (by C-G formula based on Cr of 0.73).   Medical History: Past Medical History  Diagnosis Date  . Hemochromatosis, hereditary 01/14/2012  . Occasional tremors   . PONV (postoperative nausea and vomiting)     also hard to wake up    Medications:  Scheduled:  . [START ON 11/07/2013] aspirin EC  81 mg Oral Daily  . [START ON 11/07/2013] atorvastatin  20 mg Oral Daily  . folic acid  1 mg Oral Daily  . [START ON 11/07/2013] propranolol ER  60 mg Oral Daily  . [START ON 11/08/2013] Vitamin D (Ergocalciferol)  50,000 Units Oral 2 times weekly   Infusions:  . sodium chloride 50 mL/hr (11/06/13 1407)    Assessment:  79 yr female admitted for lymphoma work-up and weakness  CT shows acute pulmonary embolism  Patient received Lovenox 40mg  VTE prophylaxis dose @ 15:16 today therefore will begin full treatment dose @ 23:00 tonight   Goal of Therapy:   Monitor platelets by anticoagulation protocol: Yes   Plan:  Lovenox 80mg  sq q12h Follow CBC  Raymon Schlarb, Joselyn Glassman, PharmD 11/06/2013,9:46 PM

## 2013-11-06 NOTE — Care Management Note (Signed)
Cm spoke with patient and daughter at the bedside concerning discharge planning. Cm consult for home assistance upon discharge. Per daughter pt resides home with spouse whom previously has bilateral foot amputations. Cm explained pt may require PT eval prior to discharge in which disposition designed around recommendation and pt preference. Per pt and daughter hope pt can transition to [facility] [employer] where daughter works. Cm provided pt with list of private duty care agencies. Will continue to follow.   Donnarae Rae,MSN,RN

## 2013-11-06 NOTE — Progress Notes (Signed)
She comes in a wheelchair. Obviously is different than 3-4 weeks ago.  New lymph nodes.  Need to admit for w/u and weakness.  Will send to [facility].  Admit note will be done.  Cindee Lame

## 2013-11-06 NOTE — Progress Notes (Signed)
Patient arrived to floor at 1219 via wheelchair.Alert and orientedx3. Placed patient comfortably in bed.  I spoke to Amy RN at [employer] office and was made aware of patient's arrival. Awaiting for orders.- Hulda Marin RN

## 2013-11-06 NOTE — H&P (Signed)
#   [id number] is admit note.  [name]

## 2013-11-07 ENCOUNTER — Inpatient Hospital Stay (HOSPITAL_COMMUNITY): Payer: Medicare Other

## 2013-11-07 DIAGNOSIS — I2699 Other pulmonary embolism without acute cor pulmonale: Secondary | ICD-10-CM

## 2013-11-07 DIAGNOSIS — R7402 Elevation of levels of lactic acid dehydrogenase (LDH): Secondary | ICD-10-CM

## 2013-11-07 DIAGNOSIS — E440 Moderate protein-calorie malnutrition: Secondary | ICD-10-CM | POA: Insufficient documentation

## 2013-11-07 LAB — COMPREHENSIVE METABOLIC PANEL
ALT: 17 U/L (ref 0–35)
AST: 21 U/L (ref 0–37)
BUN: 14 mg/dL (ref 6–23)
CO2: 22 mEq/L (ref 19–32)
CO2: 19 mEq/L (ref 19–32)
Calcium: 8.7 mg/dL (ref 8.4–10.5)
Chloride: 102 mEq/L (ref 96–112)
Creatinine, Ser: 0.9 mg/dL (ref 0.50–1.10)
Creatinine, Ser: 0.7 mg/dL (ref 0.50–1.10)
GFR calc non Af Amer: 80 mL/min — ABNORMAL LOW (ref >90)
Glucose, Bld: 166 mg/dL — ABNORMAL HIGH (ref 70–99)
Total Bilirubin: 3.6 mg/dL — ABNORMAL HIGH (ref 0.3–1.2)
Total Bilirubin: 2 mg/dL — ABNORMAL HIGH (ref 0.3–1.2)
Total Protein: 5.6 g/dL — ABNORMAL LOW (ref 6.0–8.3)

## 2013-11-07 LAB — BETA 2 MICROGLOBULIN, SERUM: Beta-2 Microglobulin: 3.43 mg/L — ABNORMAL HIGH (ref 1.01–1.73)

## 2013-11-07 LAB — CBC
HCT: 30.9 % — ABNORMAL LOW (ref 36.0–46.0)
MCH: 35 pg — ABNORMAL HIGH (ref 26.0–34.0)
MCV: 95.7 fL (ref 78.0–100.0)
Platelets: 164 10*3/uL (ref 150–400)
RDW: 15.3 % (ref 11.5–15.5)
WBC: 12.5 10*3/uL — ABNORMAL HIGH (ref 4.0–10.5)

## 2013-11-07 LAB — RETICULOCYTES (CHCC)
ABS Retic: 127.4 10*3/uL (ref 19.0–186.0)
RBC.: 3.86 MIL/uL — ABNORMAL LOW (ref 3.87–5.11)

## 2013-11-07 LAB — LACTATE DEHYDROGENASE: LDH: 521 U/L — ABNORMAL HIGH (ref 94–250)

## 2013-11-07 LAB — PROTIME-INR
INR: 1.07 (ref 0.00–1.49)
Prothrombin Time: 13.7 seconds (ref 11.6–15.2)

## 2013-11-07 MED ORDER — HEPARIN (PORCINE) IN NACL 100-0.45 UNIT/ML-% IJ SOLN
1200.0000 [IU]/h | INTRAMUSCULAR | Status: DC
  Filled 2013-11-07: qty 250

## 2013-11-07 MED ORDER — MIDAZOLAM HCL 2 MG/2ML IJ SOLN
INTRAMUSCULAR | Status: AC | PRN
  Administered 2013-11-07 (×2): 1 mg via INTRAVENOUS

## 2013-11-07 MED ORDER — FENTANYL CITRATE 0.05 MG/ML IJ SOLN
INTRAMUSCULAR | Status: AC | PRN
  Administered 2013-11-07: 50 ug via INTRAVENOUS

## 2013-11-07 MED ORDER — MIDAZOLAM HCL 2 MG/2ML IJ SOLN
INTRAMUSCULAR | Status: AC
  Filled 2013-11-07: qty 6

## 2013-11-07 MED ORDER — HEPARIN BOLUS VIA INFUSION
1750.0000 [IU] | Freq: Once | INTRAVENOUS | Status: AC
Start: 2013-11-07 — End: 2013-11-07
  Administered 2013-11-07: 1750 [IU] via INTRAVENOUS
  Filled 2013-11-07: qty 1750

## 2013-11-07 MED ORDER — HEPARIN (PORCINE) IN NACL 100-0.45 UNIT/ML-% IJ SOLN
1200.0000 [IU]/h | INTRAMUSCULAR | Status: AC
  Administered 2013-11-07 – 2013-11-08 (×2): 1200 [IU]/h via INTRAVENOUS
  Filled 2013-11-07 (×2): qty 250

## 2013-11-07 MED ORDER — ENSURE COMPLETE PO LIQD
237.0000 mL | Freq: Two times a day (BID) | ORAL | Status: DC
  Administered 2013-11-08 – 2013-11-12 (×7): 237 mL via ORAL

## 2013-11-07 MED ORDER — FENTANYL CITRATE 0.05 MG/ML IJ SOLN
INTRAMUSCULAR | Status: AC
  Filled 2013-11-07: qty 6

## 2013-11-07 NOTE — H&P (Signed)
NAMEJOELENE, BARRIERE          ACCOUNT NO.:  [id number]  MEDICAL RECORD NO.:  [id number]  LOCATION:  1308                         FACILITY:  [facility]  PHYSICIAN:  Josph Macho, M.D.  DATE OF BIRTH:  03-02-1931  DATE OF ADMISSION:  11/06/2013 DATE OF DISCHARGE:                             HISTORY & PHYSICAL   ADMITTING DIAGNOSES: 1. New systemic lymphadenopathy. 2. Weakness 3. History of hemochromatosis.  HISTORY OF PRESENT ILLNESS:  Ms. Shidler is a very nice 79 year old white female.  I have been following her for a couple years.  She has hemochromatosis.  This has not been a problem for her.  We last saw her back just I think 3 weeks ago in November.  At that point in time, I noted that her ferritin was high, but her iron studies were pretty much normal to low. She went to Dr. Ewell Poe office yesterday.  She was weak.  She was having a hard time walking because of weakness.  He did a CT scan on her.  He examined on her where she had new lymphadenopathy.  She subsequently came to me today.  She is in a wheelchair.  She has been getting weaker.  She really needs to be admitted.  As such, I went ahead and admitted her to [facility] so, I can get this workup initiated.  She has had some fever.  There has been no sweats.  Her appetite is decreased.  She has had some diarrhea.  She has had a little bit of cough.  At first, she thought that she had the flu.  She has had no rashes. There has been no leg swelling.  Currently, her performance status is ECOG 2-3.  PAST MEDICAL HISTORY:  Remarkable for: 1. Hemochromatosis. 2. Hyperlipidemia. 3. Hypertension.  ALLERGIES:  None.  ADMISSION MEDICATIONS:  Aspirin 81 mg p.o. daily, Lipitor 20 mg p.o. daily, Norco 1 p.o. q.6 h. p.r.n., Imodium 2 mg p.o. as needed for diarrhea, Inderal LA 60 mg p.o. daily, vitamin D 50,000 units 2 times a week.  SOCIAL HISTORY:  Negative for tobacco use.  There is no alcohol  use.  FAMILY HISTORY:  Noncontributory.  REVIEW OF SYSTEMS:  As stated in the history of present illness.  PHYSICAL EXAMINATION:  GENERAL:  This is a somewhat frail-appearing white female, in no obvious distress.  She is alert and oriented x3. VITAL SIGNS:  Temperature of 97.9, pulse 81, respiratory rate 14, blood pressure 116/51, weight is 179 pounds. HEAD AND NECK:  Normocephalic, atraumatic skull.  She has no ocular or oral lesions.  She does have palpable lymphadenopathy in the neck. Lymph nodes are more prominent on the left neck.  Largest is in the posterior cervical chain measuring about 1.5-2 cm.  It is somewhat firm, but mobile.  It is slightly tender.  She has some small lymph nodes in the right neck.  I do not detect any lymphadenopathy in the pharynx. LUNGS:  Clear bilaterally.  She has no rales, wheeze, or rhonchi. CARDIAC:  Regular rate and rhythm with a normal S1, S2.  There are no murmurs, rubs, or bruits. ABDOMEN:  Soft.  She has good bowel sounds.  There is no  fluid wave. There is no guarding or rebound tenderness.  She has no palpable hepatosplenomegaly.  There may be some slight tenderness in the left upper quadrant. AXILLARY:  A large left axilla lymph node.  I think despite measures close to 6-7 cm, it is firm and nonmobile.  The right axilla does show some smaller lymph nodes. EXTREMITIES:  No clubbing, cyanosis, or edema.  She has a 3/5 strength in her legs bilaterally.  She has decent range of motion of her joints. SKIN:  No rashes, ecchymosis, or petechiae. NEUROLOGICAL:  No focal neurological deficits.  LABORATORY STUDIES:  White cell count is 16.8, hemoglobin 13, hematocrit 37, platelet count 189.  IMPRESSION:  Ms. Hemberger is a very charming 79 year old white female. Again, I have seen her for hemochromatosis.  Now, I have asked whether she probably had lymphoma.  We need to get her admitted.  I am not sure as to why she has this weakness.  I do not  suspect any kind of cord compression.  She did bring the CT scan that she had done of her abdomen and pelvis from prior imaging.  I did not see anything on there that looked suspicious.  I do not know if she has any kind of paraneoplastic process that might be causing this weakness.  We will get her on some IV fluids.  We will see how her blood counts look tomorrow.  Again, a biopsy is quickly indicated.  She has this large left axillary lymph node.  I will see if Radiology can do core biopsies.  Again, we must have core biopsies non-aspirates in order to ascertain the histology if this is a lymphoma.  If this is not lymphoma, then I think we are in deep trouble.  I think anything other than non-Hodgkin lymphoma or Hodgkin disease would be incredibly difficult to try to treat.  I want to just try to get her feeling better.  Overcome with some IV fluids.  We can certainly accomplished this.  I will get a CT scan while she is in the hospital.  I may need to do a bone marrow biopsy, depending on all what we find with our scans and with the pathology.  We will try to move this along as quickly as we can.  She comes in with her friend.  Ms. Holton understands the situation. She knows that we do not have a diagnosis yet, but this likely will be lymphoma.  She is a full code for right now.     Josph Macho, M.D.     PRE/MEDQ  D:  11/06/2013  T:  11/07/2013  Job:  [id number]

## 2013-11-07 NOTE — Progress Notes (Signed)
INITIAL NUTRITION ASSESSMENT  DOCUMENTATION CODES Per approved criteria  -Non-severe (moderate) malnutrition in the context of acute illness or injury -Obesity Unspecified  Pt meets criteria for MODERATE MALNUTRITION in the context of ACUTE ILLNESS as evidenced by 5% weight loss in less than one month, estimated energy intake <75% of estimated energy needs, and mild to moderate wasting evidenced in physical exam.  INTERVENTION: Diet advancement per MD discretion Encourage PO intake as tolerated when diet is advanced Add Ensure Complete BID once diet is advanced  NUTRITION DIAGNOSIS: Inadequate oral intake related to poor appetite, weakness, and diarrhea as evidenced by 5% weight loss in less than one month.   Goal: Pt to meet >/= 90% of their estimated nutrition needs   Monitor:  Diet advancement PO intake Weight Labs  Reason for Assessment: Consult/ MST  79 y.o. female  Admitting Dx: <principal problem not specified>  ASSESSMENT: 79 year old female with history of hemochromatosis.  Pt reports that she had been eating well until the weekend after Thanksgiving; she started feeling very weak and her appetite became very poor. Pt reports that for the past 3 weeks she has been eating very little primarily bananas, orange juice, and some soup. Pt also reports having diarrhea for 4 days PTA. Pt states that she usually weighs 190 lbs and her weight recently dropped to 177 lbs. Weight history shows 5% wt loss within the past 3 weeks.   Nutrition Focused Physical Exam:  Subcutaneous Fat:  Orbital Region: wnl Upper Arm Region: mild wasting Thoracic and Lumbar Region: NA  Muscle:  Temple Region: mild wasting Clavicle Bone Region: mild/moderate wasting Clavicle and Acromion Bone Region: mild wasting Scapular Bone Region: NA Dorsal Hand: moderate wasting Patellar Region: wnl Anterior Thigh Region: wnl Posterior Calf Region: wnl  Edema: non-pitting RLE and LLE  edema  Height: Ht Readings from Last 1 Encounters:  11/06/13 5\' 3"  (1.6 m)    Weight: Wt Readings from Last 1 Encounters:  11/07/13 181 lb 7 oz (82.3 kg)    Ideal Body Weight: 115 lbs  % Ideal Body Weight: 157%  Wt Readings from Last 10 Encounters:  11/07/13 181 lb 7 oz (82.3 kg)  11/06/13 179 lb (81.194 kg)  10/19/13 188 lb (85.276 kg)  05/29/13 185 lb (83.915 kg)  02/15/13 184 lb (83.462 kg)  08/03/12 188 lb (85.276 kg)  01/14/12 189 lb (85.73 kg)    Usual Body Weight: 190 lbs  % Usual Body Weight: 94%  BMI:  Body mass index is 32.15 kg/(m^2). (Obese)   Estimated Nutritional Needs: Kcal: 1600-1800 Protein: 80-90 grams Fluid: >/= 1.8 L/day  Skin: non-pitting RLE and LLE edema; stage 1 pressure ulcer on anus  Diet Order: NPO  EDUCATION NEEDS: -No education needs identified at this time   Intake/Output Summary (Last 24 hours) at 11/07/13 1309 Last data filed at 11/07/13 0818  Gross per 24 hour  Intake   1191 ml  Output    125 ml  Net   1066 ml    Last BM: 12/15  Labs:   Recent Labs Lab 11/06/13 0945 11/06/13 1507 11/07/13 0345  NA 136  --  132*  K 4.3  --  4.1  CL 99  --  102  CO2 22  --  19  BUN 16  --  14  CREATININE 0.90 0.73 0.70  CALCIUM 8.9  --  8.7  GLUCOSE 166*  --  121*    CBG (last 3)  No results found for this  basename: GLUCAP,  in the last 72 hours  Scheduled Meds: . aspirin EC  81 mg Oral Daily  . atorvastatin  20 mg Oral Daily  . folic acid  1 mg Oral Daily  . propranolol ER  60 mg Oral Daily  . [START ON 11/08/2013] Vitamin D (Ergocalciferol)  50,000 Units Oral 2 times weekly    Continuous Infusions: . sodium chloride 50 mL/hr at 11/07/13 1009    Past Medical History  Diagnosis Date  . Hemochromatosis, hereditary 01/14/2012  . Occasional tremors   . PONV (postoperative nausea and vomiting)     also hard to wake up    Past Surgical History  Procedure Laterality Date  . Abdominal hysterectomy    . 3 bladder  surgeries    . Cholecystectomy    . Cataract extraction, bilateral    . Joint replacement      bilateral knees  . Left ankle surgery    . Tonsillectomy      Ian Malkin RD, LDN Inpatient Clinical Dietitian Pager: [phone] After Hours Pager: [phone]

## 2013-11-07 NOTE — Evaluation (Signed)
Physical Therapy Evaluation Patient Details Name: [name] MRN: [id number] DOB: 1931/03/18 Today's Date: 11/07/2013 Time: 4098-1191 PT Time Calculation (min): 18 min  PT Assessment / Plan / Recommendation History of Present Illness  pt admitted with Bil PE, bil DVT's  Clinical Impression  Pt reports ambulating with daughter in hall. Pt amb to BR. Awaiting test. Pt will benefit from PT to address problems. F/U PT recommendations to be determined at next visit.    PT Assessment  Patient needs continued PT services    Follow Up Recommendations  Home health PT    Does the patient have the potential to tolerate intense rehabilitation      Barriers to Discharge        Equipment Recommendations  None recommended by PT    Recommendations for Other Services     Frequency Min 3X/week    Precautions / Restrictions Precautions Precaution Comments: monitor sats   Pertinent Vitals/Pain Sats 91-94% RA ,no SOB noted.      Mobility  Bed Mobility Bed Mobility: Supine to Sit;Sit to Supine Supine to Sit: 7: Independent Sit to Supine: 7: Independent Transfers Transfers: Sit to Stand;Stand to Sit Sit to Stand: 5: Supervision;From bed;From chair/3-in-1;With upper extremity assist Stand to Sit: 5: Supervision;With upper extremity assist;To bed;To chair/3-in-1 Details for Transfer Assistance: only safety assistance stand by required. Ambulation/Gait Ambulation/Gait Assistance: 4: Min guard Ambulation Distance (Feet): 15 Feet (x2) Assistive device: Rolling walker Ambulation/Gait Assistance Details: Pt reports she waklked to nurses desk with  daughter and is walking to the bathroom with  assist. Gait Pattern: Within Functional Limits    Exercises     PT Diagnosis: Generalized weakness  PT Problem List: Decreased strength;Decreased range of motion;Decreased activity tolerance;Decreased mobility;Decreased safety awareness;Decreased knowledge of precautions PT Treatment  Interventions: DME instruction;Gait training;Stair training;Functional mobility training;Therapeutic activities;Patient/family education     PT Goals(Current goals can be found in the care plan section) Acute Rehab PT Goals Patient Stated Goal: I want to go home PT Goal Formulation: With patient Time For Goal Achievement: 11/21/13 Potential to Achieve Goals: Good  Visit Information  Last PT Received On: 11/07/13 Assistance Needed: +1 Reason Eval/Treat Not Completed: Medical issues which prohibited therapy History of Present Illness: pt admitted with Bil PE, bil DVT's       Prior Functioning  Home Living Family/patient expects to be discharged to:: Private residence Living Arrangements: Spouse/significant other Available Help at Discharge: Family Type of Home: House Home Access: Stairs to enter Secretary/administrator of Steps: 2 Home Layout: One level Home Equipment: Bedside commode Prior Function Level of Independence: Independent Communication Communication: No difficulties    Cognition  Cognition Arousal/Alertness: Awake/alert Behavior During Therapy: WFL for tasks assessed/performed Overall Cognitive Status: Within Functional Limits for tasks assessed    Extremity/Trunk Assessment Upper Extremity Assessment Upper Extremity Assessment: Overall WFL for tasks assessed Lower Extremity Assessment Lower Extremity Assessment: Generalized weakness Cervical / Trunk Assessment Cervical / Trunk Assessment: Normal   Balance    End of Session PT - End of Session Activity Tolerance: Patient tolerated treatment well Patient left: in bed;with call bell/phone within reach;with bed alarm set Nurse Communication: Mobility status  GP     Rada Hay 11/07/2013, 1:51 PM Blanchard Kelch PT [phone]

## 2013-11-07 NOTE — Procedures (Signed)
US guided core biopsies of left axillary lymph node.  5 cores obtained.  No immediate complication.

## 2013-11-07 NOTE — Progress Notes (Addendum)
ANTICOAGULATION CONSULT NOTE - Initial Consult  Pharmacy Consult for heparin Indication: pulmonary embolus  No Known Allergies  Patient Measurements: Height: 5\' 3"  (160 cm) Weight: 181 lb 7 oz (82.3 kg) IBW/kg (Calculated) : 52.4 Heparin Dosing Weight: 70.5kg   Vital Signs: Temp: 99.4 F (37.4 C) (12/17 0444) Temp src: Oral (12/17 0444) BP: 111/54 mmHg (12/17 0444) Pulse Rate: 90 (12/17 0444)  Labs:  Recent Labs  11/06/13 0945 11/06/13 1507 11/07/13 0345  HGB 13.1 12.5 11.3*  HCT 36.6 31.7* 30.9*  PLT 189 131* 164  APTT  --   --  37  LABPROT  --   --  13.7  INR  --   --  1.07  CREATININE 0.90 0.73 0.70    Estimated Creatinine Clearance: 57 ml/min (by C-G formula based on Cr of 0.7).   Assessment: 46 yoF well-known to Pharmacy for enoxaparin dosing for pulmonary embolus is being transitioned to heparin from enoxaparin for PE treatment in anticipation of several invasive upcoming procedures while admitted for new lymphoma work-up. Patient to be transitioned back to enoxaparin prior to discharge  CT chest on 12/16 showed acute pulmonary embolus  INR on admit normal at 1.07  Patient was started on enoxaparin 1mg /kg dosing q12h (80mg  SQ BID)  Enoxaparin 80mg  SQ given 12/16 at 2217, enoxaparin order already d/c'd this AM by MD  Will start heparin withOUT a bolus since still inside 12h dosing window of enoxaparin dose and patient should already be fully anticoagulated  Hgb has decreased slightly since admission to 11.3 from 13.1  Platelets are stable at 164  No bleeding has been noted  SCr WNL at 0.7, CrCl ~ 55 ml/min (CG)  Goal of Therapy:  Heparin level 0.3-0.7 units/ml Monitor platelets by anticoagulation protocol: Yes   Plan:  - heparin IV gtt at 1200 units/hr starting now (75% through enoxaparin 12h dosing interval) - 8 hour heparin level  - consider residual LMWH effect on this 1st heparin level - daily heparin level, CBC - monitor for bleeding -  follow-up procedure plans and timing of switch back to enoxaparin   Thank you for the consult.  Tomi Bamberger, PharmD, BCPS Clinical Pharmacist Pager: [phone] Pharmacy: [phone] 11/07/2013 7:45 AM    ADDENDUM: - LE dopplers performed and showed DVT in peroneal veins - patient for biopsy this AM (hopeful before noon) so will now wait to start heparin - heparin will need to be started WITH a bolus since will be outside of enoxaparin dosing interval - will follow-up biopsy timing to ensure proper timing of heparin and heparin levels   PLAN: - after biopsy, start heparin WITH a half bolus of 1750 units then start gtt at 1200 units/hr - 8 hour heparin level - monitor for bleeding - follow-up daily heparin level and CBC  Tomi Bamberger, PharmD, BCPS Clinical Pharmacist Pager: [phone] Pharmacy: [phone] 11/07/2013 9:49 AM

## 2013-11-07 NOTE — Progress Notes (Signed)
[name] is feeling better. She still feels weak. Appetite is not that could. I will see about getting dietary and that physical therapy to see her.  She does have bilateral pulmonary emboli. She apparently has had pulmonary emboli in the past. I do not know how long ago this was. I'm sure that this is now secondary to her underlying malignancy.  I will get her on a heparin drip until we have all of her procedure done and then get her on to Lovenox.  CT scans shows diffuse lymphadenopathy. She is going for biopsy today. Core biopsies did not give [place] a diagnosis, then I will have surgery do an excisional biopsy on either a neck node or an inguinal node.  Her LDH is 521. This goes toward a high-grade lymphoma.   Vital signs looked okay. Temperature is 99.4. Blood pressure 111/54. Lungs are clear. She has no wheezes. Cardiac exam regular rate and rhythm. Abdomen is soft. She does have a palpable left inguinal lymph nodes. Extremities shows no clubbing cyanosis or edema.  I will do lower extremity Dopplers to evaluate for thromboembolic disease.  We still need to keep her for our procedures. She will need to have a Port-A-Cath placed once we diagnosed a malignancy.  I thank the staff on [address] for the outstanding care!!  Lawerance Sabal 1:5-7

## 2013-11-07 NOTE — Progress Notes (Signed)
Patient ID: [name], female   DOB: Jul 17, 1931, 79 y.o.   MRN: [id number] Request received for US guided left axillary vs left inguinal LN biopsy on pt with no known hx of malignancy ; now with diffuse lymphadenopathy, small LLL lung nodule on recent CT, LE DVT and acute on chronic PE. Imaging studies were reviewed by Dr. Lowella Dandy. Additional PMH as below. Exam: pt awake/alert; chest- CTA bilat; heart- RRR; abd- soft,+BS,NT; ext- FROM, trace edema; palpable left axillary/left inguinal adenopathy.    Filed Vitals:   11/06/13 1657 11/06/13 2131 11/07/13 0444 11/07/13 0445  BP:  116/48 111/54   Pulse:  90 90   Temp:  98 F (36.7 C) 99.4 F (37.4 C)   TempSrc:  Oral Oral   Resp:  16 16   Height: 5\' 3"  (1.6 m)     Weight:    181 lb 7 oz (82.3 kg)  SpO2:  94% 93%    Past Medical History  Diagnosis Date  . Hemochromatosis, hereditary 01/14/2012  . Occasional tremors   . PONV (postoperative nausea and vomiting)     also hard to wake up   Past Surgical History  Procedure Laterality Date  . Abdominal hysterectomy    . 3 bladder surgeries    . Cholecystectomy    . Cataract extraction, bilateral    . Joint replacement      bilateral knees  . Left ankle surgery    . Tonsillectomy     Ct Soft Tissue Neck W Contrast  11/06/2013   CLINICAL DATA:  New axillary nodes. Question non-Hodgkin's lymphoma.  EXAM: CT NECK, CHEST, ABDOMEN, AND PELVIS WITH CONTRAST  TECHNIQUE: Multidetector CT imaging of the neck, chest, abdomen and pelvis was performed following the standard protocol during bolus administration of intravenous contrast.  CONTRAST:  OMNIPAQUE IOHEXOL 300 MG/ML  SOLN  COMPARISON:  None.  FINDINGS: CT NECK  Diffuse lymphadenopathy. No prior to of establish chronicity. Index nodes:  *Right intra parotid -14 x 9 mm. *Rounded posterior triangle node on the right (image 47) 12 x 6 mm. *Enlarged left submandibular lymph nodes, the larger measuring 17 x 11 mm. *There are 2 low density  center nodes in the left posterior triangle, at the level of the hyoid, measuring up to 10 mm in diameter. *Enlarged nodes at the left thoracic inlet, 18 x 9 mm at the tracheoesophageal groove.  Cervical carotid atherosclerosis with at least moderate ICA/bifurcations stenosis. Unremarkable salivary glands and thyroid gland. Bilateral cataract resection. Diffuse degenerative disc and facet disease. No evidence of mass along the surfaces of the aerodigestive tract. Lateral ventriculomegaly, likely from atrophy.  CT CHEST FINDINGS  THORACIC INLET/BODY WALL:  Bulky bilateral axillary lymphadenopathy largest node or node conglomerate in the right axilla measures 3.3 x 3.3 cm. The largest on the left measures 4.8 x 3.1 cm. Left supraclavicular lymphadenopathy, 17 x 9 mm.  MEDIASTINUM:  Normal heart size. No pericardial effusion. Diffuse coronary artery atherosclerosis. Bilateral central pulmonary emboli, lobar size (to the lower lobes and right middle lobe) and segmental or smaller (to the upper lobes). No definitive right heart strain. No acute systemic arterial abnormality seen.  Mediastinal lymphadenopathy with index nodes lower left paratracheal station 13 mm diameter, and lower right periaortic (image 45) 20 x 16 mm.  LUNG WINDOWS:  No consolidation or effusion. 8 mm pulmonary nodule in the left lower lobe.  OSSEOUS:  No acute fracture.  No suspicious lytic or blastic lesions.  CT ABDOMEN AND PELVIS  FINDINGS  BODY WALL: Enlarged bilateral inguinal lymph nodes, largest on the left measuring 33 x 22 mm and on the right 40 x 21 mm.  ABDOMEN/PELVIS:  Liver: No focal abnormality.  Biliary: Cholecystectomy.  Pancreas: Unremarkable.  Spleen: Unremarkable.  Adrenals: Unremarkable.  Kidneys and ureters: No hydronephrosis or stone.  Bladder: Low bladder base consistent with pelvic floor laxity.  Reproductive: Hysterectomy.  Bowel: Colonic diverticulosis, non complicated. No pericecal inflammation. Duodenal diverticulum.   Retroperitoneum: Diffuse retroperitoneal lymphadenopathy, both periaortic, preaortic, bilateral iliac chain (external and internal), and small bowel mesenteric. Index nodes: Left periaortic, infrarenal, 39 x 29 mm (image 73); left pelvic sidewall, 32 x 19 mm (image 104), right external iliac chain (image 103) 21 mm.  Peritoneum: No free fluid or gas.  Vascular: Extensive atherosclerosis of the aorta and branch vessels, with large irregular plaque in the infrarenal aorta. There is a peripherally calcified 1.6 cm splenic artery aneurysm.  OSSEOUS: No acute abnormalities.  Critical Value/emergent results were called by telephone at the time of interpretation on 11/06/2013 at 9:28 PM to Dr.  Michele Rockers  , who verbally acknowledged these results.  IMPRESSION: 1. Acute pulmonary embolism with bilateral lobar and smaller clot. 2. Diffuse lymphadenopathy: cervical, intrathoracic, axillary, inguinal, and intra-abdominal. No prior imaging to compare; findings most consistent with lymphoma. 3. Small cavitary nodes in the left posterior cervical triangle are unexpected in the setting of untreated lymphoma. Attention on follow-up. 4. 8 mm pulmonary nodule in the left lower lobe. If the patient is at high risk for bronchogenic carcinoma, follow-up chest CT at 3-23months is recommended. If the patient is at low risk for bronchogenic carcinoma, follow-up chest CT at 6-12 months is recommended. This recommendation follows the consensus statement: Guidelines for Management of Small Pulmonary Nodules Detected on CT Scans: A Statement from the Fleischner Society as published in Radiology 2005; 237:395-400.   Electronically Signed   By: Tiburcio Pea M.D.   On: 11/06/2013 21:29   Ct Chest W Contrast  11/06/2013   CLINICAL DATA:  New axillary nodes. Question non-Hodgkin's lymphoma.  EXAM: CT NECK, CHEST, ABDOMEN, AND PELVIS WITH CONTRAST  TECHNIQUE: Multidetector CT imaging of the neck, chest, abdomen and pelvis was  performed following the standard protocol during bolus administration of intravenous contrast.  CONTRAST:  OMNIPAQUE IOHEXOL 300 MG/ML  SOLN  COMPARISON:  None.  FINDINGS: CT NECK  Diffuse lymphadenopathy. No prior to of establish chronicity. Index nodes:  *Right intra parotid -14 x 9 mm. *Rounded posterior triangle node on the right (image 47) 12 x 6 mm. *Enlarged left submandibular lymph nodes, the larger measuring 17 x 11 mm. *There are 2 low density center nodes in the left posterior triangle, at the level of the hyoid, measuring up to 10 mm in diameter. *Enlarged nodes at the left thoracic inlet, 18 x 9 mm at the tracheoesophageal groove.  Cervical carotid atherosclerosis with at least moderate ICA/bifurcations stenosis. Unremarkable salivary glands and thyroid gland. Bilateral cataract resection. Diffuse degenerative disc and facet disease. No evidence of mass along the surfaces of the aerodigestive tract. Lateral ventriculomegaly, likely from atrophy.  CT CHEST FINDINGS  THORACIC INLET/BODY WALL:  Bulky bilateral axillary lymphadenopathy largest node or node conglomerate in the right axilla measures 3.3 x 3.3 cm. The largest on the left measures 4.8 x 3.1 cm. Left supraclavicular lymphadenopathy, 17 x 9 mm.  MEDIASTINUM:  Normal heart size. No pericardial effusion. Diffuse coronary artery atherosclerosis. Bilateral central pulmonary emboli, lobar size (to the lower  lobes and right middle lobe) and segmental or smaller (to the upper lobes). No definitive right heart strain. No acute systemic arterial abnormality seen.  Mediastinal lymphadenopathy with index nodes lower left paratracheal station 13 mm diameter, and lower right periaortic (image 45) 20 x 16 mm.  LUNG WINDOWS:  No consolidation or effusion. 8 mm pulmonary nodule in the left lower lobe.  OSSEOUS:  No acute fracture.  No suspicious lytic or blastic lesions.  CT ABDOMEN AND PELVIS FINDINGS  BODY WALL: Enlarged bilateral inguinal lymph nodes,  largest on the left measuring 33 x 22 mm and on the right 40 x 21 mm.  ABDOMEN/PELVIS:  Liver: No focal abnormality.  Biliary: Cholecystectomy.  Pancreas: Unremarkable.  Spleen: Unremarkable.  Adrenals: Unremarkable.  Kidneys and ureters: No hydronephrosis or stone.  Bladder: Low bladder base consistent with pelvic floor laxity.  Reproductive: Hysterectomy.  Bowel: Colonic diverticulosis, non complicated. No pericecal inflammation. Duodenal diverticulum.  Retroperitoneum: Diffuse retroperitoneal lymphadenopathy, both periaortic, preaortic, bilateral iliac chain (external and internal), and small bowel mesenteric. Index nodes: Left periaortic, infrarenal, 39 x 29 mm (image 73); left pelvic sidewall, 32 x 19 mm (image 104), right external iliac chain (image 103) 21 mm.  Peritoneum: No free fluid or gas.  Vascular: Extensive atherosclerosis of the aorta and branch vessels, with large irregular plaque in the infrarenal aorta. There is a peripherally calcified 1.6 cm splenic artery aneurysm.  OSSEOUS: No acute abnormalities.  Critical Value/emergent results were called by telephone at the time of interpretation on 11/06/2013 at 9:28 PM to Dr.  Michele Rockers  , who verbally acknowledged these results.  IMPRESSION: 1. Acute pulmonary embolism with bilateral lobar and smaller clot. 2. Diffuse lymphadenopathy: cervical, intrathoracic, axillary, inguinal, and intra-abdominal. No prior imaging to compare; findings most consistent with lymphoma. 3. Small cavitary nodes in the left posterior cervical triangle are unexpected in the setting of untreated lymphoma. Attention on follow-up. 4. 8 mm pulmonary nodule in the left lower lobe. If the patient is at high risk for bronchogenic carcinoma, follow-up chest CT at 3-52months is recommended. If the patient is at low risk for bronchogenic carcinoma, follow-up chest CT at 6-12 months is recommended. This recommendation follows the consensus statement: Guidelines for Management  of Small Pulmonary Nodules Detected on CT Scans: A Statement from the Fleischner Society as published in Radiology 2005; 237:395-400.   Electronically Signed   By: Tiburcio Pea M.D.   On: 11/06/2013 21:29   Ct Abdomen Pelvis W Contrast  11/06/2013   CLINICAL DATA:  New axillary nodes. Question non-Hodgkin's lymphoma.  EXAM: CT NECK, CHEST, ABDOMEN, AND PELVIS WITH CONTRAST  TECHNIQUE: Multidetector CT imaging of the neck, chest, abdomen and pelvis was performed following the standard protocol during bolus administration of intravenous contrast.  CONTRAST:  OMNIPAQUE IOHEXOL 300 MG/ML  SOLN  COMPARISON:  None.  FINDINGS: CT NECK  Diffuse lymphadenopathy. No prior to of establish chronicity. Index nodes:  *Right intra parotid -14 x 9 mm. *Rounded posterior triangle node on the right (image 47) 12 x 6 mm. *Enlarged left submandibular lymph nodes, the larger measuring 17 x 11 mm. *There are 2 low density center nodes in the left posterior triangle, at the level of the hyoid, measuring up to 10 mm in diameter. *Enlarged nodes at the left thoracic inlet, 18 x 9 mm at the tracheoesophageal groove.  Cervical carotid atherosclerosis with at least moderate ICA/bifurcations stenosis. Unremarkable salivary glands and thyroid gland. Bilateral cataract resection. Diffuse degenerative disc and facet  disease. No evidence of mass along the surfaces of the aerodigestive tract. Lateral ventriculomegaly, likely from atrophy.  CT CHEST FINDINGS  THORACIC INLET/BODY WALL:  Bulky bilateral axillary lymphadenopathy largest node or node conglomerate in the right axilla measures 3.3 x 3.3 cm. The largest on the left measures 4.8 x 3.1 cm. Left supraclavicular lymphadenopathy, 17 x 9 mm.  MEDIASTINUM:  Normal heart size. No pericardial effusion. Diffuse coronary artery atherosclerosis. Bilateral central pulmonary emboli, lobar size (to the lower lobes and right middle lobe) and segmental or smaller (to the upper lobes). No  definitive right heart strain. No acute systemic arterial abnormality seen.  Mediastinal lymphadenopathy with index nodes lower left paratracheal station 13 mm diameter, and lower right periaortic (image 45) 20 x 16 mm.  LUNG WINDOWS:  No consolidation or effusion. 8 mm pulmonary nodule in the left lower lobe.  OSSEOUS:  No acute fracture.  No suspicious lytic or blastic lesions.  CT ABDOMEN AND PELVIS FINDINGS  BODY WALL: Enlarged bilateral inguinal lymph nodes, largest on the left measuring 33 x 22 mm and on the right 40 x 21 mm.  ABDOMEN/PELVIS:  Liver: No focal abnormality.  Biliary: Cholecystectomy.  Pancreas: Unremarkable.  Spleen: Unremarkable.  Adrenals: Unremarkable.  Kidneys and ureters: No hydronephrosis or stone.  Bladder: Low bladder base consistent with pelvic floor laxity.  Reproductive: Hysterectomy.  Bowel: Colonic diverticulosis, non complicated. No pericecal inflammation. Duodenal diverticulum.  Retroperitoneum: Diffuse retroperitoneal lymphadenopathy, both periaortic, preaortic, bilateral iliac chain (external and internal), and small bowel mesenteric. Index nodes: Left periaortic, infrarenal, 39 x 29 mm (image 73); left pelvic sidewall, 32 x 19 mm (image 104), right external iliac chain (image 103) 21 mm.  Peritoneum: No free fluid or gas.  Vascular: Extensive atherosclerosis of the aorta and branch vessels, with large irregular plaque in the infrarenal aorta. There is a peripherally calcified 1.6 cm splenic artery aneurysm.  OSSEOUS: No acute abnormalities.  Critical Value/emergent results were called by telephone at the time of interpretation on 11/06/2013 at 9:28 PM to Dr.  Michele Rockers  , who verbally acknowledged these results.  IMPRESSION: 1. Acute pulmonary embolism with bilateral lobar and smaller clot. 2. Diffuse lymphadenopathy: cervical, intrathoracic, axillary, inguinal, and intra-abdominal. No prior imaging to compare; findings most consistent with lymphoma. 3. Small  cavitary nodes in the left posterior cervical triangle are unexpected in the setting of untreated lymphoma. Attention on follow-up. 4. 8 mm pulmonary nodule in the left lower lobe. If the patient is at high risk for bronchogenic carcinoma, follow-up chest CT at 3-49months is recommended. If the patient is at low risk for bronchogenic carcinoma, follow-up chest CT at 6-12 months is recommended. This recommendation follows the consensus statement: Guidelines for Management of Small Pulmonary Nodules Detected on CT Scans: A Statement from the Fleischner Society as published in Radiology 2005; 237:395-400.   Electronically Signed   By: Tiburcio Pea M.D.   On: 11/06/2013 21:29  Results for orders placed during the hospital encounter of 11/06/13  CBC      Result Value Range   WBC 14.6 (*) 4.0 - 10.5 K/uL   RBC 3.16 (*) 3.87 - 5.11 MIL/uL   Hemoglobin 12.5  12.0 - 15.0 g/dL   HCT 11.9 (*) 14.7 - 82.9 %   MCV 100.3 (*) 78.0 - 100.0 fL   MCH 39.6 (*) 26.0 - 34.0 pg   MCHC 39.4 (*) 30.0 - 36.0 g/dL   RDW 56.2 (*) 13.0 - 86.5 %   Platelets 131 (*)  150 - 400 K/uL  CREATININE, SERUM      Result Value Range   Creatinine, Ser 0.73  0.50 - 1.10 mg/dL   GFR calc non Af Amer 79 (*) >90 mL/min   GFR calc Af Amer >90  >90 mL/min  APTT      Result Value Range   aPTT 37  24 - 37 seconds  CBC      Result Value Range   WBC 12.5 (*) 4.0 - 10.5 K/uL   RBC 3.23 (*) 3.87 - 5.11 MIL/uL   Hemoglobin 11.3 (*) 12.0 - 15.0 g/dL   HCT 09.3 (*) 23.5 - 57.3 %   MCV 95.7  78.0 - 100.0 fL   MCH 35.0 (*) 26.0 - 34.0 pg   MCHC 36.6 (*) 30.0 - 36.0 g/dL   RDW 22.0  25.4 - 27.0 %   Platelets 164  150 - 400 K/uL  COMPREHENSIVE METABOLIC PANEL      Result Value Range   Sodium 132 (*) 135 - 145 mEq/L   Potassium 4.1  3.5 - 5.1 mEq/L   Chloride 102  96 - 112 mEq/L   CO2 19  19 - 32 mEq/L   Glucose, Bld 121 (*) 70 - 99 mg/dL   BUN 14  6 - 23 mg/dL   Creatinine, Ser 6.23  0.50 - 1.10 mg/dL   Calcium 8.7  8.4 - 76.2 mg/dL    Total Protein 5.6 (*) 6.0 - 8.3 g/dL   Albumin 2.8 (*) 3.5 - 5.2 g/dL   AST 21  0 - 37 U/L   ALT 17  0 - 35 U/L   Alkaline Phosphatase 99  39 - 117 U/L   Total Bilirubin 2.0 (*) 0.3 - 1.2 mg/dL   GFR calc non Af Amer 80 (*) >90 mL/min   GFR calc Af Amer >90  >90 mL/min  PROTIME-INR      Result Value Range   Prothrombin Time 13.7  11.6 - 15.2 seconds   INR 1.07  0.00 - 1.49   A/P: Pt with hx of bilat peroneal /LE DVT, bilat PE, diffuse lymphadenopathy, small LLL lung nodule. Plan is for US guided left axillary vs left inguinal LN biopsy today. Details/risks of procedure d/w pt/daughter with their understanding and consent.

## 2013-11-07 NOTE — Progress Notes (Signed)
VASCULAR LAB PRELIMINARY  PRELIMINARY  PRELIMINARY  PRELIMINARY  Bilateral lower extremity venous duplex  completed.    Preliminary report:  Bilateral:  DVT noted in the peroneal veins.  No evidence of superficial thrombosis.  No Baker's cyst.  Many prominent lymph notes noted in the groin bilaterally.  Modesty Rudy, RVT 11/07/2013, 9:05 AM

## 2013-11-08 ENCOUNTER — Inpatient Hospital Stay (HOSPITAL_COMMUNITY): Payer: Medicare Other

## 2013-11-08 DIAGNOSIS — I82409 Acute embolism and thrombosis of unspecified deep veins of unspecified lower extremity: Secondary | ICD-10-CM

## 2013-11-08 LAB — COMPREHENSIVE METABOLIC PANEL
ALT: 15 U/L (ref 0–35)
Alkaline Phosphatase: 90 U/L (ref 39–117)
BUN: 11 mg/dL (ref 6–23)
CO2: 19 mEq/L (ref 19–32)
Calcium: 8.5 mg/dL (ref 8.4–10.5)
Chloride: 107 mEq/L (ref 96–112)
GFR calc Af Amer: 76 mL/min — ABNORMAL LOW (ref >90)
GFR calc non Af Amer: 66 mL/min — ABNORMAL LOW (ref >90)
Glucose, Bld: 122 mg/dL — ABNORMAL HIGH (ref 70–99)
Potassium: 3.5 mEq/L (ref 3.5–5.1)
Sodium: 137 mEq/L (ref 135–145)
Total Bilirubin: 1.7 mg/dL — ABNORMAL HIGH (ref 0.3–1.2)

## 2013-11-08 LAB — CBC
Hemoglobin: 11.9 g/dL — ABNORMAL LOW (ref 12.0–15.0)
MCH: 42.3 pg — ABNORMAL HIGH (ref 26.0–34.0)
MCV: 102.8 fL — ABNORMAL HIGH (ref 78.0–100.0)
RBC: 2.81 MIL/uL — ABNORMAL LOW (ref 3.87–5.11)
WBC: 11.6 10*3/uL — ABNORMAL HIGH (ref 4.0–10.5)

## 2013-11-08 MED ORDER — MIDAZOLAM HCL 2 MG/2ML IJ SOLN
INTRAMUSCULAR | Status: AC
  Filled 2013-11-08: qty 4

## 2013-11-08 MED ORDER — HEPARIN (PORCINE) IN NACL 100-0.45 UNIT/ML-% IJ SOLN
1200.0000 [IU]/h | INTRAMUSCULAR | Status: DC
  Administered 2013-11-08: 1200 [IU]/h via INTRAVENOUS
  Filled 2013-11-08 (×2): qty 250

## 2013-11-08 MED ORDER — LIDOCAINE-EPINEPHRINE (PF) 2 %-1:200000 IJ SOLN
INTRAMUSCULAR | Status: AC
  Filled 2013-11-08: qty 20

## 2013-11-08 MED ORDER — MIDAZOLAM HCL 2 MG/2ML IJ SOLN
INTRAMUSCULAR | Status: AC | PRN
  Administered 2013-11-08: 2 mg via INTRAVENOUS

## 2013-11-08 MED ORDER — FENTANYL CITRATE 0.05 MG/ML IJ SOLN
INTRAMUSCULAR | Status: AC
  Filled 2013-11-08: qty 4

## 2013-11-08 MED ORDER — HEPARIN SOD (PORK) LOCK FLUSH 100 UNIT/ML IV SOLN
INTRAVENOUS | Status: AC
  Filled 2013-11-08: qty 5

## 2013-11-08 MED ORDER — FENTANYL CITRATE 0.05 MG/ML IJ SOLN
INTRAMUSCULAR | Status: AC | PRN
  Administered 2013-11-08: 100 ug via INTRAVENOUS

## 2013-11-08 MED ORDER — CEFAZOLIN SODIUM-DEXTROSE 2-3 GM-% IV SOLR
2.0000 g | INTRAVENOUS | Status: AC
  Administered 2013-11-08: 2 g via INTRAVENOUS
  Filled 2013-11-08: qty 50

## 2013-11-08 NOTE — Progress Notes (Signed)
Monitor platelets by anticoagulation protocol: Yes (This note is entered for core measure tracking).  [name] K 11/08/2013   5:52 AM

## 2013-11-08 NOTE — Progress Notes (Signed)
Subjective: Pt s/p left axillary LN bx 12/17; path pending; request now received for port a cath placement for planned chemotherapy secondary to working diagnosis of lymphoma. See recent note from 12/17 for additional PMH.  Objective: Vital signs in last 24 hours: Temp:  [98.1 F (36.7 C)-99.4 F (37.4 C)] 99 F (37.2 C) (12/18 0527) Pulse Rate:  [78-92] 92 (12/18 0527) Resp:  [16-25] 24 (12/18 0527) BP: (109-136)/(50-61) 115/51 mmHg (12/18 0527) SpO2:  [92 %-95 %] 93 % (12/18 0527) Weight:  [192 lb 7.4 oz (87.3 kg)] 192 lb 7.4 oz (87.3 kg) (12/18 0717) Last BM Date: 11/05/13  Intake/Output from previous day: 12/17 0701 - 12/18 0700 In: 1657.3 [P.O.:240; I.V.:1417.3] Out: -  Intake/Output this shift: Total I/O In: 240 [P.O.:240] Out: -   Pt awake/alert; chest- CTA bilat; heart- RRR; abd- soft,+BS,NT; ext- trace-1+ edema bilat, palpable left ax/inguinal adenopathy  Lab Results:   Recent Labs  11/07/13 0345 11/08/13 0130  WBC 12.5* 11.6*  HGB 11.3* 11.9*  HCT 30.9* 28.9*  PLT 164 145*   BMET  Recent Labs  11/07/13 0345 11/08/13 0750  NA 132* 137  K 4.1 3.5  CL 102 107  CO2 19 19  GLUCOSE 121* 122*  BUN 14 11  CREATININE 0.70 0.82  CALCIUM 8.7 8.5   PT/INR  Recent Labs  11/07/13 0345  LABPROT 13.7  INR 1.07   ABG No results found for this basename: PHART, PCO2, PO2, HCO3,  in the last 72 hours  Studies/Results: Ct Soft Tissue Neck W Contrast  11/06/2013   CLINICAL DATA:  New axillary nodes. Question non-Hodgkin's lymphoma.  EXAM: CT NECK, CHEST, ABDOMEN, AND PELVIS WITH CONTRAST  TECHNIQUE: Multidetector CT imaging of the neck, chest, abdomen and pelvis was performed following the standard protocol during bolus administration of intravenous contrast.  CONTRAST:  OMNIPAQUE IOHEXOL 300 MG/ML  SOLN  COMPARISON:  None.  FINDINGS: CT NECK  Diffuse lymphadenopathy. No prior to of establish chronicity. Index nodes:  *Right intra parotid -14 x 9 mm.  *Rounded posterior triangle node on the right (image 47) 12 x 6 mm. *Enlarged left submandibular lymph nodes, the larger measuring 17 x 11 mm. *There are 2 low density center nodes in the left posterior triangle, at the level of the hyoid, measuring up to 10 mm in diameter. *Enlarged nodes at the left thoracic inlet, 18 x 9 mm at the tracheoesophageal groove.  Cervical carotid atherosclerosis with at least moderate ICA/bifurcations stenosis. Unremarkable salivary glands and thyroid gland. Bilateral cataract resection. Diffuse degenerative disc and facet disease. No evidence of mass along the surfaces of the aerodigestive tract. Lateral ventriculomegaly, likely from atrophy.  CT CHEST FINDINGS  THORACIC INLET/BODY WALL:  Bulky bilateral axillary lymphadenopathy largest node or node conglomerate in the right axilla measures 3.3 x 3.3 cm. The largest on the left measures 4.8 x 3.1 cm. Left supraclavicular lymphadenopathy, 17 x 9 mm.  MEDIASTINUM:  Normal heart size. No pericardial effusion. Diffuse coronary artery atherosclerosis. Bilateral central pulmonary emboli, lobar size (to the lower lobes and right middle lobe) and segmental or smaller (to the upper lobes). No definitive right heart strain. No acute systemic arterial abnormality seen.  Mediastinal lymphadenopathy with index nodes lower left paratracheal station 13 mm diameter, and lower right periaortic (image 45) 20 x 16 mm.  LUNG WINDOWS:  No consolidation or effusion. 8 mm pulmonary nodule in the left lower lobe.  OSSEOUS:  No acute fracture.  No suspicious lytic or blastic lesions.  CT ABDOMEN AND PELVIS FINDINGS  BODY WALL: Enlarged bilateral inguinal lymph nodes, largest on the left measuring 33 x 22 mm and on the right 40 x 21 mm.  ABDOMEN/PELVIS:  Liver: No focal abnormality.  Biliary: Cholecystectomy.  Pancreas: Unremarkable.  Spleen: Unremarkable.  Adrenals: Unremarkable.  Kidneys and ureters: No hydronephrosis or stone.  Bladder: Low bladder base  consistent with pelvic floor laxity.  Reproductive: Hysterectomy.  Bowel: Colonic diverticulosis, non complicated. No pericecal inflammation. Duodenal diverticulum.  Retroperitoneum: Diffuse retroperitoneal lymphadenopathy, both periaortic, preaortic, bilateral iliac chain (external and internal), and small bowel mesenteric. Index nodes: Left periaortic, infrarenal, 39 x 29 mm (image 73); left pelvic sidewall, 32 x 19 mm (image 104), right external iliac chain (image 103) 21 mm.  Peritoneum: No free fluid or gas.  Vascular: Extensive atherosclerosis of the aorta and branch vessels, with large irregular plaque in the infrarenal aorta. There is a peripherally calcified 1.6 cm splenic artery aneurysm.  OSSEOUS: No acute abnormalities.  Critical Value/emergent results were called by telephone at the time of interpretation on 11/06/2013 at 9:28 PM to Dr.  Michele Rockers  , who verbally acknowledged these results.  IMPRESSION: 1. Acute pulmonary embolism with bilateral lobar and smaller clot. 2. Diffuse lymphadenopathy: cervical, intrathoracic, axillary, inguinal, and intra-abdominal. No prior imaging to compare; findings most consistent with lymphoma. 3. Small cavitary nodes in the left posterior cervical triangle are unexpected in the setting of untreated lymphoma. Attention on follow-up. 4. 8 mm pulmonary nodule in the left lower lobe. If the patient is at high risk for bronchogenic carcinoma, follow-up chest CT at 3-23months is recommended. If the patient is at low risk for bronchogenic carcinoma, follow-up chest CT at 6-12 months is recommended. This recommendation follows the consensus statement: Guidelines for Management of Small Pulmonary Nodules Detected on CT Scans: A Statement from the Fleischner Society as published in Radiology 2005; 237:395-400.   Electronically Signed   By: Tiburcio Pea M.D.   On: 11/06/2013 21:29   Ct Chest W Contrast  11/06/2013   CLINICAL DATA:  New axillary nodes. Question  non-Hodgkin's lymphoma.  EXAM: CT NECK, CHEST, ABDOMEN, AND PELVIS WITH CONTRAST  TECHNIQUE: Multidetector CT imaging of the neck, chest, abdomen and pelvis was performed following the standard protocol during bolus administration of intravenous contrast.  CONTRAST:  OMNIPAQUE IOHEXOL 300 MG/ML  SOLN  COMPARISON:  None.  FINDINGS: CT NECK  Diffuse lymphadenopathy. No prior to of establish chronicity. Index nodes:  *Right intra parotid -14 x 9 mm. *Rounded posterior triangle node on the right (image 47) 12 x 6 mm. *Enlarged left submandibular lymph nodes, the larger measuring 17 x 11 mm. *There are 2 low density center nodes in the left posterior triangle, at the level of the hyoid, measuring up to 10 mm in diameter. *Enlarged nodes at the left thoracic inlet, 18 x 9 mm at the tracheoesophageal groove.  Cervical carotid atherosclerosis with at least moderate ICA/bifurcations stenosis. Unremarkable salivary glands and thyroid gland. Bilateral cataract resection. Diffuse degenerative disc and facet disease. No evidence of mass along the surfaces of the aerodigestive tract. Lateral ventriculomegaly, likely from atrophy.  CT CHEST FINDINGS  THORACIC INLET/BODY WALL:  Bulky bilateral axillary lymphadenopathy largest node or node conglomerate in the right axilla measures 3.3 x 3.3 cm. The largest on the left measures 4.8 x 3.1 cm. Left supraclavicular lymphadenopathy, 17 x 9 mm.  MEDIASTINUM:  Normal heart size. No pericardial effusion. Diffuse coronary artery atherosclerosis. Bilateral central pulmonary emboli, lobar  size (to the lower lobes and right middle lobe) and segmental or smaller (to the upper lobes). No definitive right heart strain. No acute systemic arterial abnormality seen.  Mediastinal lymphadenopathy with index nodes lower left paratracheal station 13 mm diameter, and lower right periaortic (image 45) 20 x 16 mm.  LUNG WINDOWS:  No consolidation or effusion. 8 mm pulmonary nodule in the left lower  lobe.  OSSEOUS:  No acute fracture.  No suspicious lytic or blastic lesions.  CT ABDOMEN AND PELVIS FINDINGS  BODY WALL: Enlarged bilateral inguinal lymph nodes, largest on the left measuring 33 x 22 mm and on the right 40 x 21 mm.  ABDOMEN/PELVIS:  Liver: No focal abnormality.  Biliary: Cholecystectomy.  Pancreas: Unremarkable.  Spleen: Unremarkable.  Adrenals: Unremarkable.  Kidneys and ureters: No hydronephrosis or stone.  Bladder: Low bladder base consistent with pelvic floor laxity.  Reproductive: Hysterectomy.  Bowel: Colonic diverticulosis, non complicated. No pericecal inflammation. Duodenal diverticulum.  Retroperitoneum: Diffuse retroperitoneal lymphadenopathy, both periaortic, preaortic, bilateral iliac chain (external and internal), and small bowel mesenteric. Index nodes: Left periaortic, infrarenal, 39 x 29 mm (image 73); left pelvic sidewall, 32 x 19 mm (image 104), right external iliac chain (image 103) 21 mm.  Peritoneum: No free fluid or gas.  Vascular: Extensive atherosclerosis of the aorta and branch vessels, with large irregular plaque in the infrarenal aorta. There is a peripherally calcified 1.6 cm splenic artery aneurysm.  OSSEOUS: No acute abnormalities.  Critical Value/emergent results were called by telephone at the time of interpretation on 11/06/2013 at 9:28 PM to Dr.  Michele Rockers  , who verbally acknowledged these results.  IMPRESSION: 1. Acute pulmonary embolism with bilateral lobar and smaller clot. 2. Diffuse lymphadenopathy: cervical, intrathoracic, axillary, inguinal, and intra-abdominal. No prior imaging to compare; findings most consistent with lymphoma. 3. Small cavitary nodes in the left posterior cervical triangle are unexpected in the setting of untreated lymphoma. Attention on follow-up. 4. 8 mm pulmonary nodule in the left lower lobe. If the patient is at high risk for bronchogenic carcinoma, follow-up chest CT at 3-49months is recommended. If the patient is at low  risk for bronchogenic carcinoma, follow-up chest CT at 6-12 months is recommended. This recommendation follows the consensus statement: Guidelines for Management of Small Pulmonary Nodules Detected on CT Scans: A Statement from the Fleischner Society as published in Radiology 2005; 237:395-400.   Electronically Signed   By: Tiburcio Pea M.D.   On: 11/06/2013 21:29   Ct Abdomen Pelvis W Contrast  11/06/2013   CLINICAL DATA:  New axillary nodes. Question non-Hodgkin's lymphoma.  EXAM: CT NECK, CHEST, ABDOMEN, AND PELVIS WITH CONTRAST  TECHNIQUE: Multidetector CT imaging of the neck, chest, abdomen and pelvis was performed following the standard protocol during bolus administration of intravenous contrast.  CONTRAST:  OMNIPAQUE IOHEXOL 300 MG/ML  SOLN  COMPARISON:  None.  FINDINGS: CT NECK  Diffuse lymphadenopathy. No prior to of establish chronicity. Index nodes:  *Right intra parotid -14 x 9 mm. *Rounded posterior triangle node on the right (image 47) 12 x 6 mm. *Enlarged left submandibular lymph nodes, the larger measuring 17 x 11 mm. *There are 2 low density center nodes in the left posterior triangle, at the level of the hyoid, measuring up to 10 mm in diameter. *Enlarged nodes at the left thoracic inlet, 18 x 9 mm at the tracheoesophageal groove.  Cervical carotid atherosclerosis with at least moderate ICA/bifurcations stenosis. Unremarkable salivary glands and thyroid gland. Bilateral cataract resection. Diffuse  degenerative disc and facet disease. No evidence of mass along the surfaces of the aerodigestive tract. Lateral ventriculomegaly, likely from atrophy.  CT CHEST FINDINGS  THORACIC INLET/BODY WALL:  Bulky bilateral axillary lymphadenopathy largest node or node conglomerate in the right axilla measures 3.3 x 3.3 cm. The largest on the left measures 4.8 x 3.1 cm. Left supraclavicular lymphadenopathy, 17 x 9 mm.  MEDIASTINUM:  Normal heart size. No pericardial effusion. Diffuse coronary artery  atherosclerosis. Bilateral central pulmonary emboli, lobar size (to the lower lobes and right middle lobe) and segmental or smaller (to the upper lobes). No definitive right heart strain. No acute systemic arterial abnormality seen.  Mediastinal lymphadenopathy with index nodes lower left paratracheal station 13 mm diameter, and lower right periaortic (image 45) 20 x 16 mm.  LUNG WINDOWS:  No consolidation or effusion. 8 mm pulmonary nodule in the left lower lobe.  OSSEOUS:  No acute fracture.  No suspicious lytic or blastic lesions.  CT ABDOMEN AND PELVIS FINDINGS  BODY WALL: Enlarged bilateral inguinal lymph nodes, largest on the left measuring 33 x 22 mm and on the right 40 x 21 mm.  ABDOMEN/PELVIS:  Liver: No focal abnormality.  Biliary: Cholecystectomy.  Pancreas: Unremarkable.  Spleen: Unremarkable.  Adrenals: Unremarkable.  Kidneys and ureters: No hydronephrosis or stone.  Bladder: Low bladder base consistent with pelvic floor laxity.  Reproductive: Hysterectomy.  Bowel: Colonic diverticulosis, non complicated. No pericecal inflammation. Duodenal diverticulum.  Retroperitoneum: Diffuse retroperitoneal lymphadenopathy, both periaortic, preaortic, bilateral iliac chain (external and internal), and small bowel mesenteric. Index nodes: Left periaortic, infrarenal, 39 x 29 mm (image 73); left pelvic sidewall, 32 x 19 mm (image 104), right external iliac chain (image 103) 21 mm.  Peritoneum: No free fluid or gas.  Vascular: Extensive atherosclerosis of the aorta and branch vessels, with large irregular plaque in the infrarenal aorta. There is a peripherally calcified 1.6 cm splenic artery aneurysm.  OSSEOUS: No acute abnormalities.  Critical Value/emergent results were called by telephone at the time of interpretation on 11/06/2013 at 9:28 PM to Dr.  Michele Rockers  , who verbally acknowledged these results.  IMPRESSION: 1. Acute pulmonary embolism with bilateral lobar and smaller clot. 2. Diffuse  lymphadenopathy: cervical, intrathoracic, axillary, inguinal, and intra-abdominal. No prior imaging to compare; findings most consistent with lymphoma. 3. Small cavitary nodes in the left posterior cervical triangle are unexpected in the setting of untreated lymphoma. Attention on follow-up. 4. 8 mm pulmonary nodule in the left lower lobe. If the patient is at high risk for bronchogenic carcinoma, follow-up chest CT at 3-54months is recommended. If the patient is at low risk for bronchogenic carcinoma, follow-up chest CT at 6-12 months is recommended. This recommendation follows the consensus statement: Guidelines for Management of Small Pulmonary Nodules Detected on CT Scans: A Statement from the Fleischner Society as published in Radiology 2005; 237:395-400.   Electronically Signed   By: Tiburcio Pea M.D.   On: 11/06/2013 21:29   US Biopsy  11/07/2013   CLINICAL DATA:  79 year old with diffuse lymphadenopathy. Evaluate for neoplasm.  EXAM: ULTRASOUND-GUIDED BIOPSY OF LEFT AXILLARY LYMPH NODE  Physician: Rachelle Hora. Henn, MD  MEDICATIONS: Versed 2 mg, fentanyl 25 mcg. A radiology nurse monitored the patient for moderate sedation.  ANESTHESIA/SEDATION: Moderate sedation time: 10 min  FLUOROSCOPY TIME:  None  PROCEDURE: The procedure was explained to the patient. The risks and benefits of the procedure were discussed and the patient's questions were addressed. Informed consent was obtained from the patient.  The patient's left axilla and inguinal regions were evaluated with ultrasound. The left axilla was selected for biopsy. The left axilla region was prepped with Betadine and sterile field was created. The skin was anesthetized with 1% lidocaine. Using ultrasound guidance, 5 core biopsies were obtained within the largest left axillary lymph node with an 18 gauge core device. The specimens were placed in saline. Small bandage was placed over the puncture site.  COMPLICATIONS: None  FINDINGS: There are large  hypoechoic lymph nodes in the left axilla. The largest lymph node was selected for biopsy. Needle position was confirmed within the lesion during the core biopsies. There are enlarged bilateral inguinal lymph nodes. Inguinal lymph nodes are more normal appearing than the left axillary lymph nodes.  IMPRESSION: Ultrasound-guided core biopsies of a left axillary lymph node.   Electronically Signed   By: Richarda Overlie M.D.   On: 11/07/2013 16:23    Anti-infectives: Anti-infectives   None      Assessment/Plan: Pt s/p left axillary LN bx 12/17 to r/o lymphoma; path pending; request now received for port a cath placement for planned chemotherapy for working diagnosis of lymphoma. Details/risks of procedure d/w pt with her understanding and consent.  LOS: 2 days    ALLRED,D [facility] 11/08/2013

## 2013-11-08 NOTE — Care Management Note (Signed)
    Page 1 of 1   11/08/2013     12:45:49 PM   CARE MANAGEMENT NOTE 11/08/2013  Patient:  [name], [name]   Account Number:  [id number]  Date Initiated:  11/08/2013  Documentation initiated by:  Lanier Clam  Subjective/Objective Assessment:   79 Y/O F ADMITTED W/WEAKNESS.BILAT PE.     Action/Plan:   FROM HOME.   Anticipated DC Date:  11/12/2013   Anticipated DC Plan:  HOME W HOME HEALTH SERVICES      DC Planning Services  CM consult      Choice offered to / List presented to:             Status of service:  In process, will continue to follow Medicare Important Message given?   (If response is "NO", the following Medicare IM given date fields will be blank) Date Medicare IM given:   Date Additional Medicare IM given:    Discharge Disposition:    Per UR Regulation:  Reviewed for med. necessity/level of care/duration of stay  If discussed at Long Length of Stay Meetings, dates discussed:    Comments:  11/08/13 Ruble Pumphrey RN,BSN NCM [phone] ONC/IR FOLLOWING-FOR PAC PLACEMENT-CHEMO FOR WORKING DX LYMPHOMA.

## 2013-11-08 NOTE — Progress Notes (Signed)
Rx Brief Anticoagulation note: IV Heparin  Assessement:  HL= 0.41 units/ml after 1750 unit bolus and drip @ 1200 units/hr x ~ 8.5 hrs.  No IV interuptions/bleeding per RN  LD Lovenox was 12/16 @ 2215- hopefully gone at this point  Plan:  Continue heparin drip @ 1200 units/hr  Recheck HL today @ 11am   Lorenza Evangelist 11/08/2013 2:58 AM

## 2013-11-08 NOTE — Procedures (Signed)
Successful placement of right IJ approach port-a-cath with tip at the superior caval atrial junction. The catheter is ready for immediate use. No immediate post procedural complications. 

## 2013-11-08 NOTE — Progress Notes (Signed)
ANTICOAGULATION CONSULT NOTE - Follow up  Pharmacy Consult for heparin Indication: pulmonary embolus  No Known Allergies  Patient Measurements: Height: 5\' 3"  (160 cm) Weight: 192 lb 7.4 oz (87.3 kg) IBW/kg (Calculated) : 52.4 Heparin Dosing Weight: 70.5kg  Labs:  Recent Labs  11/06/13 1507 11/07/13 0345 11/08/13 0130 11/08/13 0750 11/08/13 1150  HGB 12.5 11.3* 11.9*  --   --   HCT 31.7* 30.9* 28.9*  --   --   PLT 131* 164 145*  --   --   APTT  --  37  --   --   --   LABPROT  --  13.7  --   --   --   INR  --  1.07  --   --   --   HEPARINUNFRC  --   --  0.41  --  0.45  CREATININE 0.73 0.70  --  0.82  --    Estimated Creatinine Clearance: 57.4 ml/min (by C-G formula based on Cr of 0.82).  Assessment: 48 yoF well-known to Pharmacy for enoxaparin dosing for pulmonary embolus is being transitioned to heparin from enoxaparin for PE treatment in anticipation of several invasive upcoming procedures while admitted for new lymphoma work-up. Patient to be transitioned back to enoxaparin prior to discharge  CT chest on 12/16 showed acute pulmonary embolus  INR on admit normal at 1.07  Patient was started on enoxaparin 1mg /kg dosing q12h (80mg  SQ BID), one dose given 12/16 at 2217, discontinued by MD  Heparin infusion begun 12/17 with 1750 unit bolus and infusion at 1200 units/hr  Follow up Heparin levels 0.41 early this am, and 0.45 units/ml at 12N (both in target range)  Plan PAC placement in IR this afternoon, stopping Heparin at 1330  No sign bleed  Goal of Therapy:  Heparin level 0.3-0.7 units/ml Monitor platelets by anticoagulation protocol: Yes   Plan:  - Continue heparin IV gtt at 1200 units/hr  - 8 hour heparin level after heparin resumed post- PAC placement - daily heparin level, CBC - monitor for bleeding - follow-up procedure plans and timing of switch back to enoxaparin   Thank you for the consult.  Otho Bellows PharmD Pager [phone] 11/08/2013, 1:03  PM

## 2013-11-08 NOTE — Progress Notes (Signed)
Echo Lab  2D Echocardiogram completed.  Lakesa Coste L Marjory Meints, RDCS 11/08/2013 10:37 AM

## 2013-11-08 NOTE — Progress Notes (Signed)
[name] is improving slowly but surely. No surprise, that she had bilateral lower extremity DVT. She is on heparin right now.  Is going to be very difficult to do outpatient Lovenox. As such, I think we will go with Xarelto. There is more data with using Xarelto in cancer patients with thromboembolic disease.  She had a biopsy yesterday. I very much appreciate radiology's help. She now needs to have a Port-A-Cath placed.  I will get an echocardiogram on her to assess her cardiac status.   Her appetite is doing better.   I appreciate the help from physical therapy and nutrition services.  I spoke to her daughter yesterday. Her daughter is very worried that her mom will not be able to be at home with her husband. Her husband has significant health issues of his own. This may be a problem.  On her physical exam, temperature is 99. Pulse is 92. Blood pressure is 115/51. Lungs are clear. Cardiac exam regular rate and rhythm. Next of shows the lymphadenopathy. Abdomen is soft. There is no palpable hepatosplenomegaly. Extremities shows some trace edema. Skin exam shows no rashes.  Her labs show her white cell count 11.6. Hemoglobin 12. Platelets 145.  Her previous labs shows she has an elevated beta-2 microglobulin. LDH is elevated. All of this points to lymphoma.  We should have some pathology back today.  I still anticipate starting chemotherapy in the hospital.  As far as discharged is concerned, we will need to talk to her daughter and I want to talk to her husband and see how we can manage her as an outpatient.  Appreciate the great care that she is getting of the staff on [address]!!  Private[place] E.  Ephesians 6:10

## 2013-11-09 DIAGNOSIS — C8589 Other specified types of non-Hodgkin lymphoma, extranodal and solid organ sites: Secondary | ICD-10-CM

## 2013-11-09 LAB — CBC
MCH: 39.6 pg — ABNORMAL HIGH (ref 26.0–34.0)
MCHC: 39.9 g/dL — ABNORMAL HIGH (ref 30.0–36.0)
MCV: 99.3 fL (ref 78.0–100.0)
Platelets: 178 10*3/uL (ref 150–400)
RDW: 15.7 % — ABNORMAL HIGH (ref 11.5–15.5)

## 2013-11-09 LAB — HEPARIN LEVEL (UNFRACTIONATED)
Heparin Unfractionated: 0.29 IU/mL — ABNORMAL LOW (ref 0.30–0.70)
Heparin Unfractionated: 0.55 IU/mL (ref 0.30–0.70)

## 2013-11-09 MED ORDER — PROPRANOLOL HCL ER 60 MG PO CP24
60.0000 mg | ORAL_CAPSULE | Freq: Two times a day (BID) | ORAL | Status: DC
  Administered 2013-11-09 – 2013-11-10 (×2): 60 mg via ORAL
  Filled 2013-11-09 (×3): qty 1

## 2013-11-09 MED ORDER — HEPARIN (PORCINE) IN NACL 100-0.45 UNIT/ML-% IJ SOLN
1300.0000 [IU]/h | INTRAMUSCULAR | Status: DC
  Administered 2013-11-09 (×2): 1300 [IU]/h via INTRAVENOUS
  Filled 2013-11-09 (×3): qty 250

## 2013-11-09 NOTE — Progress Notes (Signed)
ANTICOAGULATION CONSULT NOTE - Follow up  Pharmacy Consult for heparin Indication: pulmonary embolus  No Known Allergies  Patient Measurements: Height: 5\' 3"  (160 cm) Weight: 192 lb 7.4 oz (87.3 kg) IBW/kg (Calculated) : 52.4 Heparin Dosing Weight: 70.5kg  Labs:  Recent Labs  11/06/13 1507 11/07/13 0345 11/08/13 0130 11/08/13 0750 11/08/13 1150 11/09/13 0527  HGB 12.5 11.3* 11.9*  --   --   --   HCT 31.7* 30.9* 28.9*  --   --   --   PLT 131* 164 145*  --   --   --   APTT  --  37  --   --   --   --   LABPROT  --  13.7  --   --   --   --   INR  --  1.07  --   --   --   --   HEPARINUNFRC  --   --  0.41  --  0.45 0.29*  CREATININE 0.73 0.70  --  0.82  --   --    Estimated Creatinine Clearance: 57.4 ml/min (by C-G formula based on Cr of 0.82).  Assessment: 65 yoF well-known to Pharmacy for enoxaparin dosing for pulmonary embolus is being transitioned to heparin from enoxaparin for PE treatment in anticipation of several invasive upcoming procedures while admitted for new lymphoma work-up. Patient to be transitioned back to enoxaparin prior to discharge  CT chest on 12/16 showed acute pulmonary embolus  INR on admit normal at 1.07  Patient was started on enoxaparin 1mg /kg dosing q12h (80mg  SQ BID), one dose given 12/16 at 2217, discontinued by MD  Heparin infusion restarted post- PAC placement @ 1200 units/hr  HL=0.29 this am- slightly below goal  No IV interuptions or bleeding per RN  Goal of Therapy:  Heparin level 0.3-0.7 units/ml Monitor platelets by anticoagulation protocol: Yes   Plan:   Increase heparin drip to 1300 units/hr  Recheck HL in 8 hrs (~1400)  Daily CBC/HL  Lorenza Evangelist 11/09/2013, 6:07 AM

## 2013-11-09 NOTE — Progress Notes (Signed)
11/09/13 1100  PT Visit Information  Last PT Received On 11/09/13  Reason Eval/Treat Not Completed Other (comment) (pt just amb with family, will ck after lunch)

## 2013-11-09 NOTE — Care Management Note (Signed)
Cm spoke with patient and daughter at the bedside concerning discharge planning. Treatment options pending per pathology report. Pt's daughter concerned about pt's spouse having ability to care for pt upon discharge. Cm provided pt and daughter Home Health Agency List and private duty care list. Will continue to monitor.    Roxy Manns Quint Chestnut,MSN,RN [phone]

## 2013-11-09 NOTE — Progress Notes (Signed)
[name] is doing ok.    I did speak with the pathologist yesterday. Unfortunately, they are not sure what type of malignancy this is. 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 We will find out today. If this is not lymphoma, then I would think are options for treatment might be minimal and we may need to consider supportive care and likely hospice.  She continues on heparin.  She had her Port-A-Cath placed. This will come in handy even if we don't use chemotherapy.  Her echocardiogram shows a very good ejection fraction of 60-65%.  She's eating a little better. She will do some physical therapy today.  I will speak with her family tomorrow. Her daughter is worried about her mom going home as her father has a lot of health issues and he just is not capable of taking care of [name]. As such, we may have to consider another alternative upon this chart.  As far as her anticoagulation is concerned as an outpatient, I would prefer Lovenox but I think this be incredibly difficult to do as an outpatient for her. As such, we may have to consider Xarelto. There is more data now with using Xarelto in patients with thromboembolic disease and malignancy.    On her physical exam, her blood pressure is 135/57. She is afebrile. Her lungs are clear. Cardiac exam regular in rhythm. There are no murmurs. Abdomen is soft. There is no palpable hepatospleno megaly extremities shows no clubbing cyanosis or edema. Skin exam no rashes. Neurological exam shows no focal neurological deficits.  Again, the outcome here will clearly hinge on the pathology. This will dictate all of our decisions with regard to therapy, outpatient living options and most of all prognosis.  [name] is certainly aware of the possibilities that we are looking at an she just wants to make sure that quality-of-life is a priority.  Pete E.  Psalm 31:2

## 2013-11-09 NOTE — Progress Notes (Signed)
ANTICOAGULATION CONSULT NOTE - Follow up  Pharmacy Consult for heparin Indication: pulmonary embolus  No Known Allergies  Patient Measurements: Height: 5\' 3"  (160 cm) Weight: 192 lb 7.4 oz (87.3 kg) IBW/kg (Calculated) : 52.4 Heparin Dosing Weight: 70.5kg  Labs:  Recent Labs  11/06/13 1507 11/07/13 0345  11/08/13 0130 11/08/13 0750 11/08/13 1150 11/09/13 0527 11/09/13 1403  HGB 12.5 11.3*  --  11.9*  --   --  11.2*  --   HCT 31.7* 30.9*  --  28.9*  --   --  28.1*  --   PLT 131* 164  --  145*  --   --  178  --   APTT  --  37  --   --   --   --   --   --   LABPROT  --  13.7  --   --   --   --   --   --   INR  --  1.07  --   --   --   --   --   --   HEPARINUNFRC  --   --   < > 0.41  --  0.45 0.29* 0.55  CREATININE 0.73 0.70  --   --  0.82  --   --   --   < > = values in this interval not displayed. Estimated Creatinine Clearance: 57.4 ml/min (by C-G formula based on Cr of 0.82).  Assessment: 89 yoF well-known to Pharmacy for enoxaparin dosing for pulmonary embolus is being transitioned to heparin from enoxaparin for PE treatment in anticipation of several invasive upcoming procedures while admitted for new lymphoma work-up. Patient to be transitioned back to enoxaparin prior to discharge  CT chest on 12/16 showed acute pulmonary embolus  INR on admit normal at 1.07  Patient was started on enoxaparin 1mg /kg dosing q12h (80mg  SQ BID), one dose given 12/16 at 2217, discontinued by MD  Heparin infusion restarted post- PAC placement @ 1200 units/hr  HL=0.29 at 0600 - rate increased to 1300 units/hr, follow up HL = 0.55 units/ml at 1400 today   No IV interuptions or bleeding per RN  Goal of Therapy:  Heparin level 0.3-0.7 units/ml Monitor platelets by anticoagulation protocol: Yes   Plan:   Continue heparin drip at 1300 units/hr  Daily CBC/HL  Otho Bellows PharmD Pager [phone] 11/09/2013, 3:00 PM

## 2013-11-10 DIAGNOSIS — C8590 Non-Hodgkin lymphoma, unspecified, unspecified site: Secondary | ICD-10-CM | POA: Insufficient documentation

## 2013-11-10 DIAGNOSIS — Z5111 Encounter for antineoplastic chemotherapy: Secondary | ICD-10-CM

## 2013-11-10 LAB — URINALYSIS W MICROSCOPIC + REFLEX CULTURE
Glucose, UA: NEGATIVE mg/dL
Hgb urine dipstick: NEGATIVE
Leukocytes, UA: NEGATIVE
Nitrite: NEGATIVE
Protein, ur: NEGATIVE mg/dL
Specific Gravity, Urine: 1.01 (ref 1.005–1.030)
Urine-Other: NONE SEEN
Urobilinogen, UA: 1 mg/dL (ref 0.0–1.0)

## 2013-11-10 LAB — HEPARIN LEVEL (UNFRACTIONATED)
Heparin Unfractionated: 0.38 IU/mL (ref 0.30–0.70)
Heparin Unfractionated: 0.64 IU/mL (ref 0.30–0.70)

## 2013-11-10 LAB — CBC
HCT: 20.7 % — ABNORMAL LOW (ref 36.0–46.0)
Hemoglobin: 11.6 g/dL — ABNORMAL LOW (ref 12.0–15.0)
MCH: 61.1 pg — ABNORMAL HIGH (ref 26.0–34.0)
MCHC: 56 g/dL — ABNORMAL HIGH (ref 30.0–36.0)
Platelets: 203 10*3/uL (ref 150–400)
RBC: 1.9 MIL/uL — ABNORMAL LOW (ref 3.87–5.11)
RDW: 22.7 % — ABNORMAL HIGH (ref 11.5–15.5)

## 2013-11-10 MED ORDER — VINCRISTINE SULFATE CHEMO INJECTION 1 MG/ML
1.0000 mg | Freq: Once | INTRAVENOUS | Status: AC
  Administered 2013-11-10: 1 mg via INTRAVENOUS
  Filled 2013-11-10: qty 1

## 2013-11-10 MED ORDER — HOT PACK MISC ONCOLOGY
1.0000 | Freq: Once | Status: AC | PRN
  Filled 2013-11-10: qty 1

## 2013-11-10 MED ORDER — EPINEPHRINE HCL 0.1 MG/ML IJ SOLN
0.2500 mg | Freq: Once | INTRAMUSCULAR | Status: AC | PRN
  Filled 2013-11-10: qty 2.5

## 2013-11-10 MED ORDER — EPINEPHRINE HCL 1 MG/ML IJ SOLN
0.5000 mg | Freq: Once | INTRAMUSCULAR | Status: AC | PRN
  Filled 2013-11-10: qty 1

## 2013-11-10 MED ORDER — METHYLPREDNISOLONE SODIUM SUCC 125 MG IJ SOLR
125.0000 mg | Freq: Once | INTRAMUSCULAR | Status: AC | PRN
  Filled 2013-11-10: qty 2

## 2013-11-10 MED ORDER — ACETAMINOPHEN 325 MG PO TABS
650.0000 mg | ORAL_TABLET | Freq: Once | ORAL | Status: AC
  Administered 2013-11-10: 650 mg via ORAL
  Filled 2013-11-10: qty 2

## 2013-11-10 MED ORDER — ALFUZOSIN HCL ER 10 MG PO TB24
10.0000 mg | ORAL_TABLET | Freq: Every day | ORAL | Status: DC
  Administered 2013-11-11 – 2013-11-12 (×2): 10 mg via ORAL
  Filled 2013-11-10 (×3): qty 1

## 2013-11-10 MED ORDER — COLD PACK MISC ONCOLOGY
1.0000 | Freq: Once | Status: AC | PRN
  Filled 2013-11-10: qty 1

## 2013-11-10 MED ORDER — HEPARIN SOD (PORK) LOCK FLUSH 100 UNIT/ML IV SOLN: 250.0000 [IU] | Freq: Once | INTRAVENOUS | Status: AC | PRN

## 2013-11-10 MED ORDER — DIPHENHYDRAMINE HCL 50 MG/ML IJ SOLN: 25.0000 mg | Freq: Once | INTRAMUSCULAR | Status: AC | PRN

## 2013-11-10 MED ORDER — DIPHENHYDRAMINE HCL 50 MG PO CAPS
50.0000 mg | ORAL_CAPSULE | Freq: Once | ORAL | Status: AC
  Administered 2013-11-10: 50 mg via ORAL
  Filled 2013-11-10: qty 1

## 2013-11-10 MED ORDER — SODIUM CHLORIDE 0.9 % IV SOLN: Freq: Once | INTRAVENOUS | Status: AC | PRN

## 2013-11-10 MED ORDER — ALTEPLASE 2 MG IJ SOLR
2.0000 mg | Freq: Once | INTRAMUSCULAR | Status: AC | PRN
  Filled 2013-11-10: qty 2

## 2013-11-10 MED ORDER — ALLOPURINOL 100 MG PO TABS
100.0000 mg | ORAL_TABLET | Freq: Every day | ORAL | Status: DC
  Administered 2013-11-10 – 2013-11-13 (×4): 100 mg via ORAL
  Filled 2013-11-10 (×5): qty 1

## 2013-11-10 MED ORDER — SODIUM CHLORIDE 0.9 % IJ SOLN: 3.0000 mL | INTRAMUSCULAR | Status: DC | PRN

## 2013-11-10 MED ORDER — SODIUM CHLORIDE 0.9 % IJ SOLN: 10.0000 mL | INTRAMUSCULAR | Status: DC | PRN

## 2013-11-10 MED ORDER — SODIUM CHLORIDE 0.9 % IV SOLN
600.0000 mg/m2 | Freq: Once | INTRAVENOUS | Status: AC
  Administered 2013-11-11: 1160 mg via INTRAVENOUS
  Filled 2013-11-10: qty 58

## 2013-11-10 MED ORDER — FAMOTIDINE IN NACL 20-0.9 MG/50ML-% IV SOLN
20.0000 mg | Freq: Once | INTRAVENOUS | Status: AC | PRN
  Filled 2013-11-10: qty 50

## 2013-11-10 MED ORDER — DIPHENHYDRAMINE HCL 50 MG/ML IJ SOLN: 50.0000 mg | Freq: Once | INTRAMUSCULAR | Status: AC | PRN

## 2013-11-10 MED ORDER — HEPARIN SOD (PORK) LOCK FLUSH 100 UNIT/ML IV SOLN: 500.0000 [IU] | Freq: Once | INTRAVENOUS | Status: AC | PRN

## 2013-11-10 MED ORDER — SODIUM CHLORIDE 0.9 % IV SOLN: Freq: Once | INTRAVENOUS | Status: DC

## 2013-11-10 MED ORDER — FAMCICLOVIR 500 MG PO TABS
500.0000 mg | ORAL_TABLET | Freq: Every day | ORAL | Status: DC
  Administered 2013-11-10 – 2013-11-14 (×5): 500 mg via ORAL
  Filled 2013-11-10 (×5): qty 1

## 2013-11-10 MED ORDER — DOXORUBICIN HCL CHEMO IV INJECTION 2 MG/ML
40.0000 mg/m2 | Freq: Once | INTRAVENOUS | Status: AC
  Administered 2013-11-10: 78 mg via INTRAVENOUS
  Filled 2013-11-10: qty 39

## 2013-11-10 MED ORDER — PROPRANOLOL HCL ER 60 MG PO CP24
60.0000 mg | ORAL_CAPSULE | Freq: Two times a day (BID) | ORAL | Status: DC
  Administered 2013-11-11 – 2013-11-14 (×6): 60 mg via ORAL
  Filled 2013-11-10 (×9): qty 1

## 2013-11-10 MED ORDER — HEPARIN (PORCINE) IN NACL 100-0.45 UNIT/ML-% IJ SOLN
1400.0000 [IU]/h | INTRAMUSCULAR | Status: DC
  Administered 2013-11-10 – 2013-11-11 (×3): 1400 [IU]/h via INTRAVENOUS
  Filled 2013-11-10 (×4): qty 250

## 2013-11-10 MED ORDER — SODIUM CHLORIDE 0.9 % IV SOLN
Freq: Once | INTRAVENOUS | Status: AC
  Administered 2013-11-10: 16 mg via INTRAVENOUS
  Filled 2013-11-10: qty 8

## 2013-11-10 MED ORDER — SODIUM CHLORIDE 0.9 % IV SOLN
375.0000 mg/m2 | Freq: Once | INTRAVENOUS | Status: AC
  Administered 2013-11-10: 700 mg via INTRAVENOUS
  Filled 2013-11-10: qty 70

## 2013-11-10 MED ORDER — PREDNISONE 50 MG PO TABS
60.0000 mg | ORAL_TABLET | Freq: Every day | ORAL | Status: DC
  Administered 2013-11-10 – 2013-11-13 (×4): 60 mg via ORAL
  Filled 2013-11-10 (×5): qty 1

## 2013-11-10 MED ORDER — ALBUTEROL SULFATE (5 MG/ML) 0.5% IN NEBU: 2.5000 mg | INHALATION_SOLUTION | Freq: Once | RESPIRATORY_TRACT | Status: AC | PRN

## 2013-11-10 NOTE — Progress Notes (Signed)
Patient voided 250 cc and bladder scan done by NT obtained 587,called Dr. Myna Hidalgo and ordered I &O cath .Hulda Marin RN

## 2013-11-10 NOTE — Progress Notes (Signed)
ANTICOAGULATION CONSULT NOTE - Follow up  Pharmacy Consult for heparin Indication: pulmonary embolus  No Known Allergies  Patient Measurements: Height: 5\' 3"  (160 cm) Weight: 184 lb 4.9 oz (83.6 kg) IBW/kg (Calculated) : 52.4 Heparin Dosing Weight: 70.5kg  Labs:  Recent Labs  11/08/13 0130 11/08/13 0750  11/09/13 0527 11/09/13 1403 11/10/13 0445  HGB 11.9*  --   --  11.2*  --  11.6*  HCT 28.9*  --   --  28.1*  --  20.7*  PLT 145*  --   --  178  --  203  HEPARINUNFRC 0.41  --   < > 0.29* 0.55 0.38  CREATININE  --  0.82  --   --   --   --   < > = values in this interval not displayed. Estimated Creatinine Clearance: 56.1 ml/min (by C-G formula based on Cr of 0.82).  Assessment: 29 yoF well-known to Pharmacy for enoxaparin dosing for pulmonary embolus is being transitioned to heparin from enoxaparin for PE treatment in anticipation of several invasive upcoming procedures while admitted for new lymphoma work-up. Patient to be transitioned back to enoxaparin prior to discharge  CT chest on 12/16 showed acute pulmonary embolus  INR on admit normal at 1.07  Patient was started on enoxaparin 1mg /kg dosing q12h (80mg  SQ BID), one dose given 12/16 at 2217, discontinued by MD and transitioned to IV hpearin  Heparin infusion restarted post- PAC placement @ 1200 units/hr  Daily heparin level this AM 0.38, therapeutic but decreasing.   No IV interuptions or bleeding per RN  SCr ok, CrCl 56 ml/min  Goal of Therapy:  Heparin level 0.3-0.7 units/ml Monitor platelets by anticoagulation protocol: Yes   Plan:   Increase heparin drip at 1400 units/hr and recheck in 8 hours  Daily CBC/HL  Haynes Hoehn, PharmD, BCPS 11/10/2013, 7:10 AM  Pager: [phone]

## 2013-11-10 NOTE — Progress Notes (Signed)
Checked PT Cancellation Note  Patient Details Name: [name] MRN: [id number] DOB: [birth year]   Cancelled Treatment:     checked with pt and family. Pt had walked with her dtr this morning to nurses station and doing well at supervision level with RW. Currently pt with low BP at 96/40 and working to help improve this before nurse to begin chemo at this time. Will hold with PT at this time but continue to check on pt. Encourage nursing staff to walk with pt also as she is at a supervision level for ambulation. We will continue to follow her as well with focuses on strengthening, activity tolerance and ambulation safety and balance.    Hanley Seamen, [place] Pager: [phone] 11/10/2013  11/10/2013, 2:53 PM

## 2013-11-10 NOTE — Progress Notes (Signed)
In and out done, patient tolerated the procedure, obtained 800cc of amber colored urine.- Hulda Marin RN

## 2013-11-10 NOTE — Progress Notes (Signed)
ANTICOAGULATION CONSULT NOTE - Follow up  Pharmacy Consult for heparin Indication: pulmonary embolus  No Known Allergies  Patient Measurements: Height: 5\' 3"  (160 cm) Weight: 184 lb 4.9 oz (83.6 kg) IBW/kg (Calculated) : 52.4 Heparin Dosing Weight: 70.5kg  Labs:  Recent Labs  11/08/13 0130 11/08/13 0750  11/09/13 0527 11/09/13 1403 11/10/13 0445 11/10/13 1618  HGB 11.9*  --   --  11.2*  --  11.6*  --   HCT 28.9*  --   --  28.1*  --  20.7*  --   PLT 145*  --   --  178  --  203  --   HEPARINUNFRC 0.41  --   < > 0.29* 0.55 0.38 0.64  CREATININE  --  0.82  --   --   --   --   --   < > = values in this interval not displayed. Estimated Creatinine Clearance: 56.1 ml/min (by C-G formula based on Cr of 0.82).  Assessment: 70 yoF well-known to Pharmacy for enoxaparin dosing for pulmonary embolus is being transitioned to heparin from enoxaparin for PE treatment in anticipation of several invasive upcoming procedures while admitted for new lymphoma work-up. Patient to be transitioned back to enoxaparin prior to discharge  CT chest on 12/16 showed acute pulmonary embolus  INR on admit normal at 1.07  Patient was started on enoxaparin 1mg /kg dosing q12h (80mg  SQ BID), one dose given 12/16 at 2217, discontinued by MD and transitioned to IV hpearin  Heparin infusion restarted post- PAC placement @ 1200 units/hr  Heparin Level therapeutic and increased after heparin rate increased from 13 ml/hr to 14 ml/hr   No IV interuptions or bleeding per RN  SCr ok, CrCl 56 ml/min  Goal of Therapy:  Heparin level 0.3-0.7 units/ml Monitor platelets by anticoagulation protocol: Yes   Plan:  1) Continue IV heparin at current rate of 1400 units/hr 2) Recheck heparin level in AM   Hessie Knows, PharmD, BCPS Pager [phone] 11/10/2013 5:06 PM

## 2013-11-10 NOTE — Progress Notes (Signed)
Dr. Myna Hidalgo notified re; patient's BP of 92/47. He said to continue to administer chemo despite of low BP. - Hulda Marin RN

## 2013-11-10 NOTE — Progress Notes (Signed)
I got the results back from pathology. 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 This is no surprise clinically.  We will proceed with systemic chemotherapy now. I'll use R.-CHOP. She will tolerate this. I will use a reduced dose because of her age and overall performance status.  I went over the side effects with her. She understands these. We will use factor support to help with her immune system.  She continues on heparin. I want to keep a heparin until we can discharge her and then probably switch her over to Xarelto this will be most convenient for outpatient management of the thromboembolic events.  I thought her about my plans for her after discharge. I told her that there is no way that he can go home. Her husband cannot care for her. With this kind of chemotherapy, side effects can occur very quickly. Her husband just is not the weekend we'll obtain this. As such, we will have to see about moving her to assisted-living. Her daughter will help [place] with this. Her daughter agrees with my recommendation.  [name] is eating a little better. We need to keep working with her on physical therapy.  Her labs look okay. Her white cell count 10.8. Hemoglobin 11.6.  Her vital signs are okay. Blood pressure 145/58. Temperature 98.1. Lungs are clear. Cardiac exam regular and rhythm with no murmurs rubs or bruits. Abdomen is soft. There is no fluid wave. There is a probable hepatosplenomegaly extremities shows no clubbing cyanosis or edema. Skin exam no rashes.  Again, we will start chemotherapy this weekend. I'll put her on allopurinol. She also will need some Famvir.  Ultimately, we will need to find placement for her and her husband once she is medically ready for discharge.  I spent a good 40 minutes with her. Her family also came in to your well with say and they agree with our plans.  I told [name] that I thought that we had a chance of getting her into  remission.  [name]  Romans 8:28

## 2013-11-11 DIAGNOSIS — R339 Retention of urine, unspecified: Secondary | ICD-10-CM

## 2013-11-11 LAB — CBC
HCT: 27.7 % — ABNORMAL LOW (ref 36.0–46.0)
Hemoglobin: 10.1 g/dL — ABNORMAL LOW (ref 12.0–15.0)
MCH: 36.3 pg — ABNORMAL HIGH (ref 26.0–34.0)
MCHC: 36.5 g/dL — ABNORMAL HIGH (ref 30.0–36.0)
Platelets: 194 10*3/uL (ref 150–400)
RDW: 15.9 % — ABNORMAL HIGH (ref 11.5–15.5)
WBC: 17.7 10*3/uL — ABNORMAL HIGH (ref 4.0–10.5)

## 2013-11-11 LAB — HEPARIN LEVEL (UNFRACTIONATED): Heparin Unfractionated: 0.59 IU/mL (ref 0.30–0.70)

## 2013-11-11 MED ORDER — FILGRASTIM 480 MCG/1.6ML IJ SOLN
480.0000 ug | Freq: Every day | INTRAMUSCULAR | Status: DC
  Administered 2013-11-11 – 2013-11-13 (×3): 480 ug via SUBCUTANEOUS
  Filled 2013-11-11 (×5): qty 1.6

## 2013-11-11 MED ORDER — ONDANSETRON 8 MG/NS 50 ML IVPB
8.0000 mg | Freq: Three times a day (TID) | INTRAVENOUS | Status: AC
  Administered 2013-11-11 – 2013-11-13 (×9): 8 mg via INTRAVENOUS
  Filled 2013-11-11 (×10): qty 8

## 2013-11-11 MED ORDER — PROMETHAZINE HCL 25 MG/ML IJ SOLN: 12.5000 mg | Freq: Four times a day (QID) | INTRAMUSCULAR | Status: DC | PRN

## 2013-11-11 NOTE — Progress Notes (Signed)
Mrs. Wendy Highman do well with her chemotherapy yesterday. Her blood pressure was a little on the low side. This is better today. We are holding her in a row if her systolic blood pressure less than 110.  We will go ahead and start Neupogen tomorrow.  She is having some sweats. This may be from chemotherapy.  Her appetite is doing pretty well. There's no nausea vomiting.  I will put on a schedule Zofran dose for the next 3 days.  We are continuing the heparin. I appreciate pharmacy help in his out. When it is time to discharge her, I will get her on Xarelto.  There is no cough or shortness of breath. She's doing a little better with her activity level. I appreciate physical therapy helping out as much they can.  Her vital signs are stable. Temperature 97.7. Blood pressure 1 02/46. Oral exam shows no mucositis. There is stable lymphadenopathy on her neck. Lungs are clear bilaterally. Cardiac exam regular in rhythm with no murmurs rubs or bruits. Abdomen is soft. She has good bowel sounds. Extremities shows no clubbing cyanosis or edema.  I did start her on Uroxatral because of urinary retention. Hopefully this will help her.  Her white cell count and 17.7. Hemoglobin 10.1. Platelet count is 194.  We will now watch her for side effects from chemotherapy.  We will continue heparin. I suspect that if all goes well, we should be able to discharge her on Tuesday or Wednesday.  I appreciate the outstanding care that she is getting from the staff on [address]!!  [place] E.  Ephesians 6:10

## 2013-11-11 NOTE — Plan of Care (Signed)
Problem: Phase I Progression Outcomes Goal: Voiding-avoid urinary catheter unless indicated Outcome: Not Met (add Reason) Retaining urine. Foley catheter placed

## 2013-11-11 NOTE — Progress Notes (Signed)
ANTICOAGULATION CONSULT NOTE - Follow up  Pharmacy Consult for heparin Indication: pulmonary embolus  No Known Allergies  Patient Measurements: Height: 5\' 3"  (160 cm) Weight: 184 lb 4.9 oz (83.6 kg) IBW/kg (Calculated) : 52.4 Heparin Dosing Weight: 70.5kg  Labs:  Recent Labs  11/08/13 0750  11/09/13 0527  11/10/13 0445 11/10/13 1618 11/11/13 0455  HGB  --   < > 11.2*  --  11.6*  --  10.1*  HCT  --   --  28.1*  --  20.7*  --  27.7*  PLT  --   --  178  --  203  --  194  HEPARINUNFRC  --   < > 0.29*  < > 0.38 0.64 0.59  CREATININE 0.82  --   --   --   --   --   --   < > = values in this interval not displayed. Estimated Creatinine Clearance: 56.1 ml/min (by C-G formula based on Cr of 0.82).  Assessment: 46 yoF well-known to Pharmacy for enoxaparin dosing for pulmonary embolus is being transitioned to heparin from enoxaparin for PE treatment in anticipation of several invasive upcoming procedures while admitted for new lymphoma work-up. Patient was started on enoxaparin 1mg /kg dosing q12h (80mg  SQ BID), one dose given 12/16 at 2217, then discontinued by MD and transitioned to IV heparin. Patient to be transitioned back to enoxaparin prior to discharge.   CT chest on 12/16 showed acute pulmonary embolus  INR on admit normal at 1.07  Heparin infusion stopped briefly for PAC-placement.  S/p cycle 1 R-CHOP 12/20  Heparin level this AM therapeutic at 0.59 at 1400 units/hr  No IV interuptions or bleeding noted  SCr ok, CrCl 56 ml/min  Goal of Therapy:  Heparin level 0.3-0.7 units/ml Monitor platelets by anticoagulation protocol: Yes   Plan:  1) Continue IV heparin at current rate of 1400 units/hr 2) Daily HL  Haynes Hoehn, PharmD, BCPS 11/11/2013, 7:14 AM  Pager: [phone]

## 2013-11-11 NOTE — Progress Notes (Signed)
Patient completed Rituxan without incident, patient tolerated well. Will continue to monitor.

## 2013-11-11 NOTE — Progress Notes (Signed)
No untoward effects noted from chemotherapy received earlier this evening.  Patient has not voiced any complaints.

## 2013-11-12 LAB — COMPREHENSIVE METABOLIC PANEL
ALT: 25 U/L (ref 0–35)
AST: 22 U/L (ref 0–37)
Albumin: 2.5 g/dL — ABNORMAL LOW (ref 3.5–5.2)
Alkaline Phosphatase: 94 U/L (ref 39–117)
Calcium: 8.9 mg/dL (ref 8.4–10.5)
Creatinine, Ser: 0.7 mg/dL (ref 0.50–1.10)
GFR calc Af Amer: >90 mL/min (ref >90)
GFR calc non Af Amer: 80 mL/min — ABNORMAL LOW (ref >90)
Glucose, Bld: 149 mg/dL — ABNORMAL HIGH (ref 70–99)
Sodium: 139 mEq/L (ref 135–145)
Total Protein: 5.3 g/dL — ABNORMAL LOW (ref 6.0–8.3)

## 2013-11-12 LAB — CBC WITH DIFFERENTIAL/PLATELET
Basophils Relative: 0 % (ref 0–1)
Eosinophils Absolute: 0 10*3/uL (ref 0.0–0.7)
Eosinophils Relative: 0 % (ref 0–5)
HCT: 24.5 % — ABNORMAL LOW (ref 36.0–46.0)
Hemoglobin: 9.4 g/dL — ABNORMAL LOW (ref 12.0–15.0)
Lymphs Abs: 1.1 10*3/uL (ref 0.7–4.0)
MCH: 38.7 pg — ABNORMAL HIGH (ref 26.0–34.0)
MCV: 100.8 fL — ABNORMAL HIGH (ref 78.0–100.0)
Monocytes Absolute: 1.4 10*3/uL — ABNORMAL HIGH (ref 0.1–1.0)
Monocytes Relative: 5 % (ref 3–12)
Platelets: 233 10*3/uL (ref 150–400)
RBC: 2.43 MIL/uL — ABNORMAL LOW (ref 3.87–5.11)
WBC: 27.5 10*3/uL — ABNORMAL HIGH (ref 4.0–10.5)

## 2013-11-12 LAB — HEPARIN LEVEL (UNFRACTIONATED): Heparin Unfractionated: 0.44 IU/mL (ref 0.30–0.70)

## 2013-11-12 MED ORDER — BETHANECHOL CHLORIDE 10 MG PO TABS
10.0000 mg | ORAL_TABLET | Freq: Three times a day (TID) | ORAL | Status: DC
  Administered 2013-11-12 – 2013-11-14 (×7): 10 mg via ORAL
  Filled 2013-11-12 (×9): qty 1

## 2013-11-12 MED ORDER — RIVAROXABAN 15 MG PO TABS
15.0000 mg | ORAL_TABLET | Freq: Two times a day (BID) | ORAL | Status: DC
  Administered 2013-11-12 – 2013-11-14 (×5): 15 mg via ORAL
  Filled 2013-11-12 (×7): qty 1

## 2013-11-12 NOTE — Progress Notes (Signed)
Ms. Space now has a Foley catheter in. She is having some problems with urinary retention. I really cannot let her go home with this in. I will have to see what else we can try to do to help with his urinary retention. She's had this problem for a while. I do have her on Uroxatral.  She's had no fever. She now is on Neupogen. I do have her on Famvir. She also is on allopurinol.  She is out of bed a little more. She still is weak. I need to make sure we get physical therapy to continue to work with her.  She and her husband will be going to assisted living. We had a talk with him over the weekend. Hopefully, this will just be temporary.  She is on heparin. I will switch her over to Xarelto for outpatient anticoagulation.  She's had no fever. There is still some slight sweats.   On her physical exam, her temperature 98.1. Pulse is 80. Blood pressure 118/51. Her head exam shows no oral lesions. There is no mucositis. There is no change in the cervical lymph adenopathy. Lungs are clear. Cardiac exam regular rate and rhythm with no murmurs rubs or bruits. Abdomen is soft with good bowel sounds. There is no fluid wave. Extremities shows no clubbing cyanosis or edema. Skin exam shows no rashes. Neurological exam shows no focal neurological deficits.  Again, we need to improve her strength before we can let her go home.  Her hemoglobin is down a little bit. We will have to watch this.  Hopefully, we might be in a position to try to discharge her in one or 2 days.  As always, I very much appreciate the great and outstanding and compassionate care that she is receiving on [address]!!  Pete E.  Psalm 91:1-2

## 2013-11-12 NOTE — Progress Notes (Signed)
Physical Therapy Treatment Patient Details Name: [name] MRN: [id number] DOB: 06/22/1931 Today's Date: 11/12/2013 Time: 1330-1350 PT Time Calculation (min): 20 min  PT Assessment / Plan / Recommendation  History of Present Illness pt admitted with Bil PE, bil DVT's   PT Comments   Pt OOB in recliner with spouse in room visiting.  Assisted pt out of recliner to amb in hallway.  Pt progressing with mobility however demon limited activity tolerance and noted DOE.  RA avg 94%.     Follow Up Recommendations  Home health PT  Pt plans to D/C to home with family support until her room is available at [facility]   Does the patient have the potential to tolerate intense rehabilitation     Barriers to Discharge        Equipment Recommendations   (Rollator Cruiser Dan Humphreys would be best due to pt's diversity of activity tolerance)    Recommendations for Other Services    Frequency Min 3X/week   Progress towards PT Goals    Plan      Precautions / Restrictions Precautions Precautions: Fall Precaution Comments: monitor sats Restrictions Weight Bearing Restrictions: No    Pertinent Vitals/Pain No c/o pain    Mobility  Bed Mobility Bed Mobility: Not assessed Details for Bed Mobility Assistance: Pt OOB in recliner Transfers Transfers: Sit to Stand;Stand to Sit Sit to Stand: 5: Supervision;From chair/3-in-1 Stand to Sit: 5: Supervision;To chair/3-in-1 Details for Transfer Assistance: slightly impulsive/anxious.  <25% VC's on safety with IV line and foley.  Required <25% VC's safety with turn completion prior to sit and hand placement to control decend.  Ambulation/Gait Ambulation/Gait Assistance: 4: Min guard;4: Min assist Ambulation Distance (Feet): 225 Feet (150', 125' one sitting rest break) Assistive device: Rolling walker Ambulation/Gait Assistance Details: Slightly impulsive and anxious.  50% VC's to decrease gait speed as pt demon 2/4 DOE requiring freq  standing rest breaks and one sitting rest break.  Pt likes the security of using RW and demon increased stability/balance with AD vs w/o.   Pt did required 75% VC's on proper use with turn completion and backward gait keeping RW close to her at all times as she attempted to park it to the side and reach outside her BOS for the recliner to sit w/o completing her turn.  Demon mild safety cognition imapirement. Pt would benefit from "Rollator Cruiser Walker" with attatched seat as she demon limited activity tolerance and fatigues.  Gait Pattern: Step-through pattern;Trunk flexed Gait velocity: too quick, VC's to decrease gait speed for increased steadyness and limited physical activity.     PT Goals (current goals can now be found in the care plan section)    Visit Information  Last PT Received On: 11/12/13 Assistance Needed: +1 History of Present Illness: pt admitted with Bil PE, bil DVT's    Subjective Data      Cognition       Balance     End of Session PT - End of Session Equipment Utilized During Treatment: Gait belt Activity Tolerance: Patient limited by fatigue Patient left: in chair   Felecia Shelling  PTA [facility]  Acute  Rehab Pager      [phone]

## 2013-11-12 NOTE — Care Management Note (Signed)
Cm spoke with patient concerning discharge planning. Per CSW pt plans to discharge home with plans to transition into a ALF in 7-10 days. Per pt's permission Cm to contact adult daughter concerning home health choice. Per daughter Genevieve Norlander to provide [facility] services at discharge. Genevieve Norlander rep Venia Minks notified of referral. Daughter request Pt to re evaluate pt prior to dc for equipment needs. Will continue to follow. Pt will require MD order with face to face for [facility] and HHPT prior to discharge.    Roxy Manns Tameaka Eichhorn,MSN,RN [phone]

## 2013-11-12 NOTE — Progress Notes (Signed)
Clinical Social Work Department BRIEF PSYCHOSOCIAL ASSESSMENT 11/12/2013  Patient:  DILYN, OSORIA     Account Number:  [id number]     Admit date:  11/06/2013  Clinical Social Worker:  Jacelyn Grip  Date/Time:  11/12/2013 11:45 AM  Referred by:  Care Management  Date Referred:  11/12/2013 Referred for  ALF Placement   Other Referral:   Interview type:  Patient Other interview type:   and patient husband and pt granddaughter at bedside    PSYCHOSOCIAL DATA Living Status:  HUSBAND Admitted from facility:   Level of care:   Primary support name:  Clydie Braun [place]/[phone] Primary support relationship to patient:  CHILD, ADULT Degree of support available:   strong    CURRENT CONCERNS Current Concerns  Post-Acute Placement   Other Concerns:    SOCIAL WORK ASSESSMENT / PLAN CSW received notification from [facility] that MD notes that pt plans for ALF at discharge.    CSW met with pt and pt husband and pt granddaughter at bedside. CSW introduced self and explained role. CSW inquired about plan for discharge. Pt discussed that MD is discussing likely discharge tomorrow. Pt stated that pt husband has found an apartment at [facility] that both pt and pt husband will be able to live in. Pt granddaughter stated that pt will not be going directly to [facility], but going home for 7 to 10 days as the apartment at the facility will not be available for 7 to 10 days. Pt family plans to provide 24 hour supervision during this time.    CSW discussed that it will also be beneficial to have [facility] services during interim time as pt awaits apartment at [facility] and pt and pt family agreeable for [facility] to follow up to arrange [facility] services.    No further social work needs identified at this time.    CSW signing off.    Please re-consult if further social work needs arise.   Assessment/plan status:  No Further Intervention Required Other assessment/ plan:    Information/referral to community resources:   Referral to [facility] for [facility] services    PATIENT'S/FAMILY'S RESPONSE TO PLAN OF CARE: Pt alert and oriented x 4. Pt family appears very supportive and actively involved in pt care. Pt seems pleased that she and her husband will be able to move to an apartment and be in an environment where they will get more assistance. Pt and pt family appreciative of CSW visit and assistance.  CSW signing off. Please re-consult if social work needs arise.     Jacklynn Lewis, MSW, LCSW Clinical Social Work [phone]

## 2013-11-13 DIAGNOSIS — R5381 Other malaise: Secondary | ICD-10-CM

## 2013-11-13 DIAGNOSIS — N329 Bladder disorder, unspecified: Secondary | ICD-10-CM

## 2013-11-13 LAB — URIC ACID: Uric Acid, Serum: 6.9 mg/dL (ref 2.4–7.0)

## 2013-11-13 LAB — URINE MICROSCOPIC-ADD ON

## 2013-11-13 LAB — COMPREHENSIVE METABOLIC PANEL
AST: 16 U/L (ref 0–37)
Albumin: 2.5 g/dL — ABNORMAL LOW (ref 3.5–5.2)
Alkaline Phosphatase: 111 U/L (ref 39–117)
BUN: 17 mg/dL (ref 6–23)
CO2: 23 mEq/L (ref 19–32)
Calcium: 8.7 mg/dL (ref 8.4–10.5)
Creatinine, Ser: 0.63 mg/dL (ref 0.50–1.10)
GFR calc Af Amer: >90 mL/min (ref >90)
GFR calc non Af Amer: 82 mL/min — ABNORMAL LOW (ref >90)
Potassium: 4 mEq/L (ref 3.5–5.1)

## 2013-11-13 LAB — CBC WITH DIFFERENTIAL/PLATELET
Basophils Absolute: 0 10*3/uL (ref 0.0–0.1)
Basophils Relative: 0 % (ref 0–1)
Eosinophils Relative: 0 % (ref 0–5)
Hemoglobin: 10 g/dL — ABNORMAL LOW (ref 12.0–15.0)
Lymphocytes Relative: 2 % — ABNORMAL LOW (ref 12–46)
Lymphs Abs: 0.6 10*3/uL — ABNORMAL LOW (ref 0.7–4.0)
MCH: 36.8 pg — ABNORMAL HIGH (ref 26.0–34.0)
MCV: 99.3 fL (ref 78.0–100.0)
Monocytes Relative: 2 % — ABNORMAL LOW (ref 3–12)
Neutrophils Relative %: 96 % — ABNORMAL HIGH (ref 43–77)
Platelets: 228 10*3/uL (ref 150–400)
RBC: 4.16 MIL/uL (ref 3.87–5.11)
RDW: 16 % — ABNORMAL HIGH (ref 11.5–15.5)
WBC: 31.8 10*3/uL — ABNORMAL HIGH (ref 4.0–10.5)

## 2013-11-13 LAB — URINALYSIS, ROUTINE W REFLEX MICROSCOPIC
Glucose, UA: NEGATIVE mg/dL
Protein, ur: NEGATIVE mg/dL
Specific Gravity, Urine: 1.019 (ref 1.005–1.030)
pH: 6.5 (ref 5.0–8.0)

## 2013-11-13 LAB — LACTATE DEHYDROGENASE: LDH: 369 U/L — ABNORMAL HIGH (ref 94–250)

## 2013-11-13 MED ORDER — BOOST / RESOURCE BREEZE PO LIQD
1.0000 | Freq: Two times a day (BID) | ORAL | Status: DC
  Administered 2013-11-13: 1 via ORAL

## 2013-11-13 MED ORDER — LEVOFLOXACIN 500 MG PO TABS
500.0000 mg | ORAL_TABLET | Freq: Every day | ORAL | Status: DC
  Administered 2013-11-13: 500 mg via ORAL
  Filled 2013-11-13 (×3): qty 1

## 2013-11-13 NOTE — Progress Notes (Signed)
NUTRITION FOLLOW UP  Intervention:   Provide Resource Breeze BID RD to continue to monitor  Nutrition Dx:   Inadequate oral intake related to poor appetite, weakness, and diarrhea as evidenced by 5% weight loss in less than one month; ongoing, poor PO intake  Goal:   Pt to meet >/= 90% of their estimated nutrition needs; not met  Monitor:   Diet advancement; PO intake; 30-50% of meals Weight; up 5 lbs from admission weight, 4 lbs below usual body weight Labs; low hemoglobin, low GFR,  Blood glucose ranging 103 to 166 mg/dL  Assessment:   79 year old female with history of hemochromatosis.  Pt diagnosed with high-grade lymphoma and has been started on chemotherapy.  Pt in the bathroom at time of visit. Per pt's daughter pt's appetite has been poor. Pt has been drinking the Ensure supplements but, they have been filling pt up and pt has not been able to eat much. Pt's daughter requesting to change supplement to [employer] on pt's behalf.    Height: Ht Readings from Last 1 Encounters:  11/06/13 5\' 3"  (1.6 m)    Weight Status:   Wt Readings from Last 1 Encounters:  11/13/13 186 lb 6.4 oz (84.55 kg)    Re-estimated needs:  Kcal: 1600-1800  Protein: 90-100 grams  Fluid: >/= 1.8 L/day  Skin: non-pitting RLE and LLE edema; stage 1 pressure ulcer around rectal area  Diet Order: General   Intake/Output Summary (Last 24 hours) at 11/13/13 1230 Last data filed at 11/13/13 1000  Gross per 24 hour  Intake    670 ml  Output   2700 ml  Net  -2030 ml    Last BM: 12/21   Labs:   Recent Labs Lab 11/08/13 0750 11/12/13 0400 11/13/13 0450  NA 137 139 137  K 3.5 4.1 4.0  CL 107 107 104  CO2 19 21 23   BUN 11 17 17   CREATININE 0.82 0.70 0.63  CALCIUM 8.5 8.9 8.7  GLUCOSE 122* 149* 162*    CBG (last 3)  No results found for this basename: GLUCAP,  in the last 72 hours  Scheduled Meds: . sodium chloride   Intravenous Once  . allopurinol  100 mg Oral Daily  .  aspirin EC  81 mg Oral Daily  . atorvastatin  20 mg Oral Daily  . bethanechol  10 mg Oral TID  . famciclovir  500 mg Oral Daily  . feeding supplement (ENSURE COMPLETE)  237 mL Oral BID BM  . filgrastim  480 mcg Subcutaneous q1800  . ondansetron (ZOFRAN) IV  8 mg Intravenous Q8H  . predniSONE  60 mg Oral Q breakfast  . propranolol ER  60 mg Oral BID  . rivaroxaban  15 mg Oral BID WC  . Vitamin D (Ergocalciferol)  50,000 Units Oral 2 times weekly    Continuous Infusions: . sodium chloride 50 mL/hr at 11/12/13 0215    Ian Malkin RD, LDN Inpatient Clinical Dietitian Pager: [phone] After Hours Pager: [phone]

## 2013-11-13 NOTE — Progress Notes (Signed)
[name] is improving slowly. I started her on Urecholine for her bladder. We'll see about she had her Foley catheter today.  I appreciate physical therapy working with her. She is doing fairly well.  She now is on Xarelto for the PE/DVT. There is no bleeding.  Her appetite seems to be picking up a little bit. She's not having nausea vomiting.  She's not noticed any cough. There's no shortness of breath.  Her vital signs are stable. Blood pressure 126/53. Temperature 98. Pulse 78. Oral exam shows no mucositis. There is no scleral icterus. The lymphadenopathy in the left neck is already resolving. Lungs are clear. Cardiac exam regular rate and rhythm. She has no murmurs rubs or bruits. Abdomen is soft. She good bowel sounds. There is no fluid wave. There is no palpable hepatosplenomegaly extremities shows no clubbing cyanosis or edema. Skin exam shows no rashes. Neurological exam shows no focal neurological deficits.  Laboratory studies white cell count is 32,000. Hemoglobin 10 platelet count 228.  Her LDH is down to 369.  Uric acid 6.9. Calcium 8.7. Blood pressure 162.  Hopefully, we will be able to let her go tomorrow. We will see how she does with the Foley catheter out. Hopefully she will have some or physical therapy.  I appreciate everyone's care and compassion for her.  Hewitt Shorts  Psalms 119:1-2

## 2013-11-14 ENCOUNTER — Other Ambulatory Visit: Payer: Self-pay | Admitting: Hematology & Oncology

## 2013-11-14 DIAGNOSIS — C8581 Other specified types of non-Hodgkin lymphoma, lymph nodes of head, face, and neck: Secondary | ICD-10-CM

## 2013-11-14 DIAGNOSIS — C8590 Non-Hodgkin lymphoma, unspecified, unspecified site: Secondary | ICD-10-CM

## 2013-11-14 LAB — CBC WITH DIFFERENTIAL/PLATELET
Eosinophils Absolute: 0 10*3/uL (ref 0.0–0.7)
Eosinophils Relative: 0 % (ref 0–5)
Lymphocytes Relative: 3 % — ABNORMAL LOW (ref 12–46)
Lymphs Abs: 0.9 10*3/uL (ref 0.7–4.0)
MCH: 36.3 pg — ABNORMAL HIGH (ref 26.0–34.0)
MCHC: 36.8 g/dL — ABNORMAL HIGH (ref 30.0–36.0)
MCV: 98.6 fL (ref 78.0–100.0)
Monocytes Absolute: 0 10*3/uL — ABNORMAL LOW (ref 0.1–1.0)
Neutrophils Relative %: 96 % — ABNORMAL HIGH (ref 43–77)
Platelets: 265 10*3/uL (ref 150–400)
RBC: 2.81 MIL/uL — ABNORMAL LOW (ref 3.87–5.11)
RDW: 15.7 % — ABNORMAL HIGH (ref 11.5–15.5)

## 2013-11-14 LAB — COMPREHENSIVE METABOLIC PANEL
AST: 14 U/L (ref 0–37)
Albumin: 2.5 g/dL — ABNORMAL LOW (ref 3.5–5.2)
BUN: 21 mg/dL (ref 6–23)
Calcium: 8.6 mg/dL (ref 8.4–10.5)
Creatinine, Ser: 0.59 mg/dL (ref 0.50–1.10)
GFR calc non Af Amer: 84 mL/min — ABNORMAL LOW (ref >90)
Potassium: 3.8 mEq/L (ref 3.5–5.1)
Total Bilirubin: 0.7 mg/dL (ref 0.3–1.2)

## 2013-11-14 MED ORDER — FAMCICLOVIR 500 MG PO TABS: 500.0000 mg | ORAL_TABLET | Freq: Every day | ORAL | Status: DC

## 2013-11-14 MED ORDER — LEVOFLOXACIN 500 MG PO TABS: 500.0000 mg | ORAL_TABLET | Freq: Every day | ORAL | Status: DC

## 2013-11-14 MED ORDER — BETHANECHOL CHLORIDE 10 MG PO TABS: 10.0000 mg | ORAL_TABLET | Freq: Three times a day (TID) | ORAL | Status: DC

## 2013-11-14 MED ORDER — PROCHLORPERAZINE MALEATE 5 MG PO TABS: 5.0000 mg | ORAL_TABLET | Freq: Four times a day (QID) | ORAL | Status: AC | PRN

## 2013-11-14 MED ORDER — RIVAROXABAN 15 MG PO TABS: 15.0000 mg | ORAL_TABLET | Freq: Two times a day (BID) | ORAL | Status: DC

## 2013-11-14 MED ORDER — PROCHLORPERAZINE MALEATE 5 MG PO TABS: 5.0000 mg | ORAL_TABLET | Freq: Four times a day (QID) | ORAL | Status: DC | PRN

## 2013-11-14 NOTE — Discharge Summary (Signed)
Physician Discharge Summary     [facility]  [phone]    [name]  MR#: [id number]  DOB:1931/10/19  Date of Admission: 11/06/2013 Date of Discharge: 11/14/2013  Attending Physician: Wendy Bathe, MD  Patient's JXB:JYNWGNF,AOZHYQM A, MD  ADMITTING DIAGNOSES:  1. New systemic lymphadenopathy.  2. Weakness  3. History of hemochromatosis.   Discharge Diagnoses: Present on Admission:  . 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 21 days Bilateral lower extremity DVT/ PE requiring anticoagulation, discharged on Xarelto Status post right IJ catheter for chemotherapy Urinary retention, improved Generalized weakness History of hemochromatosis  Treatments:   11/10/2013    ONCOLOGY TREATMENT: NON-HODGKINS LYMPHOMA R-CHOP q21d     Day 1, Cycle 1 (Started)    Chemotherapy: DOXOrubicin (ADRIAMYCIN) chemo injection 78 mg, vinCRIStine (ONCOVIN) 1 mg in sodium chloride 0.9 % 50 mL chemo infusion, cyclophosphamide (CYTOXAN) 1,160 mg in sodium chloride 0.9 % 250 mL chemo infusion, riTUXimab (RITUXAN) 700 mg in sodium chloride 0.9 % 250 mL chemo infusion       11/11/2013    ONCOLOGY TREATMENT: NON-HODGKINS LYMPHOMA R-CHOP q21d     Day 2, Cycle 1  Cytoxan 1,160 mg, prednisone 60 mg by mouth, Neupogen 480 mcg   Date 3 cycle 1, Neupogen 480 mcg, prednisone 60 mg    Day 4 cycle 1 Neupogen 480 mcg, prednisone 60 mg    Discharge Condition: Improved  Hospital Course: [name] is a very nice 79 year old white female with a history of hemochromatosis followed by Dr. Myna Howell . She was in her usual state of health until 11/06/2013 when she presented to Dr.Aronson's office a day prior with generalized weakness, unable to ambulate.CT scan of the chest abdomen and pelvis on 11/06/13 with contrast demonstrated Bulky bilateral axillary lymphadenopathy largest node or node conglomerate in the right axilla measures 3.3  x 3.3 cm. The largest on the left measures 4.8 x 3.1 cm. Left supraclavicular lymphadenopathy, 17 x 9 mm. No pericardial effusion.  In addition  Bilateral central pulmonary emboli, lobar size (to the lower lobes and right middle lobe) and segmental or smaller (to the upper lobes) was noted.  Mediastinal lymphadenopathy with index nodes lower left paratracheal station 13 mm and lower right periaortic of 20 x 16 mm was seen.No consolidation or effusion. 8 mm pulmonary nodule in the left lower lobe was present.   No suspicious lytic or blastic lesions. Enlarged bilateral inguinal lymph nodes, largest on the left measuring 33 x 22 mm and on the right 40 x 21 mm.   And diffuse retroperitoneal lymphadenopathy, both periaortic, preaortic, bilateral iliac chain (external and internal), and small bowel mesenteric were seen with  Left periaortic, infrarenal, 39 x 29 mm ,left pelvic sidewall 32 x 19 mm , right external iliac chain of 21 mm. No free fluid or gas.  Dr. Myna Howell proceeded with admission   to [facility], after these findings, consistent with lymphoma  . She had some fever, but no sweats. She had decreased appetite, and mild diarrhea. She also complained of cough, nonproductive. No rashes were noted. There was no leg swelling . No cord compression was suspected.no apparent paraneoplastic process that might have caused her weakness was noted. A core biopsy was quickly indicated, from the left axillary lymph node on 11/07/2013 by interventional radiology. Pathology was positive for high-grade lymphoma, does, R. CHOP was recommended at a reduced dose due to her overall performance status.allopurinol was started, as well  as Famvir prophylaxis. Rituxan was started on 11/10/2013, which she tolerated well with the exception of some hypotensive events, well controlled and carefully monitored, and intermittent sweats secondary to chemotherapy. Her appetite was adequate, and she had no nausea or vomiting. Zofran  however was placed prophylactically following chemotherapy for 3 days thereafter She also received Cytoxan on 12/21, Decadron, Adriamycin on 12/20, Vinvristine on 12/21 (see details above) Neupogen was given on 12/21,11/12/2013 and 11/13/2013 following chemotherapy.she is now on day 5 , post day 1 cycle 1 on 11/10/2013 until 11/11/2013   No respiratory or cardiac complaints. Physical therapy was initiated to tell the patient ambulate to increase his activity level. She did have an episode of urinary retention, for which Uroxatral  was started with hopes of improving her urinary output .urinary symptoms improved with the medicine.  The patient had had on admission initial Lovenox injection, but no other dose was initiated and did after the procedure when  anticoagulation was initiated with heparin per pharmacy, with careful monitoring of platelets.in the interim, she was found to have bilateral lower extremity DVT. That's, while waiting for the results, pharmacy was consulted, and Xarelto was recommended, especially in cancer patients with thromboembolic disease.right IJ Port-A-Cath was placed by interventional radiology on 11/08/2013, anticipating initiation of chemotherapy. beta-2 microglobulin 3.43. LDH is elevated at 521 and after chemotherapy is lower to 369. Her uric acid was 6.9. case managers where involving discharge planning, initially to home - family plans to provide 24 hour supervision during this time ,to transition into [facility]  in 7-10 days after discharge.  Her vital signs are stable.  Oral exam shows no mucositis. There is no scleral icterus. The lymphadenopathy in the left neck is already resolving. Lungs are clear. Cardiac exam regular rate and rhythm. She has no murmurs rubs or bruits. Abdomen is soft. She good bowel sounds. There is no fluid wave. There is no palpable hepatosplenomegaly extremities shows no clubbing cyanosis or edema. Skin exam shows no rashes. Neurological  exam shows no focal neurological deficits.she is stable for discharge, and is to followup with Dr. Myna Howell as outpatient, office to call.  In the interim, the patient knows to contact as if she has any questions or concerns.    LABORATORY RESULTS  CBC:   Recent Labs Lab 11/10/13 0445 11/11/13 0455 11/12/13 0400 11/13/13 0450 11/14/13 0450  WBC 10.8* 17.7* 27.5* 31.8* 29.6*  HGB 11.6* 10.1* 9.4* 10.0* 10.2*  HCT 20.7* 27.7* 24.5* 26.7* 27.7*  PLT 203 194 233 228 265  MCV 108.9* 99.6 100.8* 99.3 98.6  MCH 61.1* 36.3* 38.7* 36.8* 36.3*  MCHC 56.0* 36.5* 38.4* 37.0* 36.8*  RDW 22.7* 15.9* 16.0* 16.0* 15.7*  LYMPHSABS  --   --  1.1 0.6* 0.9  MONOABS  --   --  1.4* 0.6 0.0*  EOSABS  --   --  0.0 0.0 0.0  BASOSABS  --   --  0.0 0.0 0.3*    Chemistries    Recent Labs Lab 11/08/13 0750 11/12/13 0400 11/13/13 0450 11/14/13 0450  NA 137 139 137 136  K 3.5 4.1 4.0 3.8  CL 107 107 104 104  CO2 19 21 23 23   GLUCOSE 122* 149* 162* 148*  BUN 11 17 17 21   CREATININE 0.82 0.70 0.63 0.59  CALCIUM 8.5 8.9 8.7 8.6     Radiological Studies:   Ct Soft Tissue Neck W Contrast  11/06/2013   CLINICAL DATA:  New axillary nodes. Question non-Hodgkin's  lymphoma.  EXAM: CT NECK, CHEST, ABDOMEN, AND PELVIS WITH CONTRAST  TECHNIQUE: Multidetector CT imaging of the neck, chest, abdomen and pelvis was performed following the standard protocol during bolus administration of intravenous contrast.  CONTRAST:  OMNIPAQUE IOHEXOL 300 MG/ML  SOLN  COMPARISON:  None.  FINDINGS: CT NECK  Diffuse lymphadenopathy. No prior to of establish chronicity. Index nodes:  *Right intra parotid -14 x 9 mm. *Rounded posterior triangle node on the right (image 47) 12 x 6 mm. *Enlarged left submandibular lymph nodes, the larger measuring 17 x 11 mm. *There are 2 low density center nodes in the left posterior triangle, at the level of the hyoid, measuring up to 10 mm in diameter. *Enlarged nodes at the left thoracic  inlet, 18 x 9 mm at the tracheoesophageal groove.  Cervical carotid atherosclerosis with at least moderate ICA/bifurcations stenosis. Unremarkable salivary glands and thyroid gland. Bilateral cataract resection. Diffuse degenerative disc and facet disease. No evidence of mass along the surfaces of the aerodigestive tract. Lateral ventriculomegaly, likely from atrophy.  CT CHEST FINDINGS  THORACIC INLET/BODY WALL:  Bulky bilateral axillary lymphadenopathy largest node or node conglomerate in the right axilla measures 3.3 x 3.3 cm. The largest on the left measures 4.8 x 3.1 cm. Left supraclavicular lymphadenopathy, 17 x 9 mm.  MEDIASTINUM:  Normal heart size. No pericardial effusion. Diffuse coronary artery atherosclerosis. Bilateral central pulmonary emboli, lobar size (to the lower lobes and right middle lobe) and segmental or smaller (to the upper lobes). No definitive right heart strain. No acute systemic arterial abnormality seen.  Mediastinal lymphadenopathy with index nodes lower left paratracheal station 13 mm diameter, and lower right periaortic (image 45) 20 x 16 mm.  LUNG WINDOWS:  No consolidation or effusion. 8 mm pulmonary nodule in the left lower lobe.  OSSEOUS:  No acute fracture.  No suspicious lytic or blastic lesions.  CT ABDOMEN AND PELVIS FINDINGS  BODY WALL: Enlarged bilateral inguinal lymph nodes, largest on the left measuring 33 x 22 mm and on the right 40 x 21 mm.  ABDOMEN/PELVIS:  Liver: No focal abnormality.  Biliary: Cholecystectomy.  Pancreas: Unremarkable.  Spleen: Unremarkable.  Adrenals: Unremarkable.  Kidneys and ureters: No hydronephrosis or stone.  Bladder: Low bladder base consistent with pelvic floor laxity.  Reproductive: Hysterectomy.  Bowel: Colonic diverticulosis, non complicated. No pericecal inflammation. Duodenal diverticulum.  Retroperitoneum: Diffuse retroperitoneal lymphadenopathy, both periaortic, preaortic, bilateral iliac chain (external and internal), and small bowel  mesenteric. Index nodes: Left periaortic, infrarenal, 39 x 29 mm (image 73); left pelvic sidewall, 32 x 19 mm (image 104), right external iliac chain (image 103) 21 mm.  Peritoneum: No free fluid or gas.  Vascular: Extensive atherosclerosis of the aorta and branch vessels, with large irregular plaque in the infrarenal aorta. There is a peripherally calcified 1.6 cm splenic artery aneurysm.  OSSEOUS: No acute abnormalities.  Critical Value/emergent results were called by telephone at the time of interpretation on 11/06/2013 at 9:28 PM to Dr.  Michele Rockers  , who verbally acknowledged these results.  IMPRESSION: 1. Acute pulmonary embolism with bilateral lobar and smaller clot. 2. Diffuse lymphadenopathy: cervical, intrathoracic, axillary, inguinal, and intra-abdominal. No prior imaging to compare; findings most consistent with lymphoma. 3. Small cavitary nodes in the left posterior cervical triangle are unexpected in the setting of untreated lymphoma. Attention on follow-up. 4. 8 mm pulmonary nodule in the left lower lobe. If the patient is at high risk for bronchogenic carcinoma, follow-up chest CT at 3-6months is  recommended. If the patient is at low risk for bronchogenic carcinoma, follow-up chest CT at 6-12 months is recommended. This recommendation follows the consensus statement: Guidelines for Management of Small Pulmonary Nodules Detected on CT Scans: A Statement from the Fleischner Society as published in Radiology 2005; 237:395-400.   Electronically Signed   By: Tiburcio Pea M.D.   On: 11/06/2013 21:29   Ct Chest W Contrast  11/06/2013   CLINICAL DATA:  New axillary nodes. Question non-Hodgkin's lymphoma.  EXAM: CT NECK, CHEST, ABDOMEN, AND PELVIS WITH CONTRAST  TECHNIQUE: Multidetector CT imaging of the neck, chest, abdomen and pelvis was performed following the standard protocol during bolus administration of intravenous contrast.  CONTRAST:  OMNIPAQUE IOHEXOL 300 MG/ML  SOLN   COMPARISON:  None.  FINDINGS: CT NECK  Diffuse lymphadenopathy. No prior to of establish chronicity. Index nodes:  *Right intra parotid -14 x 9 mm. *Rounded posterior triangle node on the right (image 47) 12 x 6 mm. *Enlarged left submandibular lymph nodes, the larger measuring 17 x 11 mm. *There are 2 low density center nodes in the left posterior triangle, at the level of the hyoid, measuring up to 10 mm in diameter. *Enlarged nodes at the left thoracic inlet, 18 x 9 mm at the tracheoesophageal groove.  Cervical carotid atherosclerosis with at least moderate ICA/bifurcations stenosis. Unremarkable salivary glands and thyroid gland. Bilateral cataract resection. Diffuse degenerative disc and facet disease. No evidence of mass along the surfaces of the aerodigestive tract. Lateral ventriculomegaly, likely from atrophy.  CT CHEST FINDINGS  THORACIC INLET/BODY WALL:  Bulky bilateral axillary lymphadenopathy largest node or node conglomerate in the right axilla measures 3.3 x 3.3 cm. The largest on the left measures 4.8 x 3.1 cm. Left supraclavicular lymphadenopathy, 17 x 9 mm.  MEDIASTINUM:  Normal heart size. No pericardial effusion. Diffuse coronary artery atherosclerosis. Bilateral central pulmonary emboli, lobar size (to the lower lobes and right middle lobe) and segmental or smaller (to the upper lobes). No definitive right heart strain. No acute systemic arterial abnormality seen.  Mediastinal lymphadenopathy with index nodes lower left paratracheal station 13 mm diameter, and lower right periaortic (image 45) 20 x 16 mm.  LUNG WINDOWS:  No consolidation or effusion. 8 mm pulmonary nodule in the left lower lobe.  OSSEOUS:  No acute fracture.  No suspicious lytic or blastic lesions.  CT ABDOMEN AND PELVIS FINDINGS  BODY WALL: Enlarged bilateral inguinal lymph nodes, largest on the left measuring 33 x 22 mm and on the right 40 x 21 mm.  ABDOMEN/PELVIS:  Liver: No focal abnormality.  Biliary: Cholecystectomy.   Pancreas: Unremarkable.  Spleen: Unremarkable.  Adrenals: Unremarkable.  Kidneys and ureters: No hydronephrosis or stone.  Bladder: Low bladder base consistent with pelvic floor laxity.  Reproductive: Hysterectomy.  Bowel: Colonic diverticulosis, non complicated. No pericecal inflammation. Duodenal diverticulum.  Retroperitoneum: Diffuse retroperitoneal lymphadenopathy, both periaortic, preaortic, bilateral iliac chain (external and internal), and small bowel mesenteric. Index nodes: Left periaortic, infrarenal, 39 x 29 mm (image 73); left pelvic sidewall, 32 x 19 mm (image 104), right external iliac chain (image 103) 21 mm.  Peritoneum: No free fluid or gas.  Vascular: Extensive atherosclerosis of the aorta and branch vessels, with large irregular plaque in the infrarenal aorta. There is a peripherally calcified 1.6 cm splenic artery aneurysm.  OSSEOUS: No acute abnormalities.  Critical Value/emergent results were called by telephone at the time of interpretation on 11/06/2013 at 9:28 PM to Dr.  Michele Rockers  , who  verbally acknowledged these results.  IMPRESSION: 1. Acute pulmonary embolism with bilateral lobar and smaller clot. 2. Diffuse lymphadenopathy: cervical, intrathoracic, axillary, inguinal, and intra-abdominal. No prior imaging to compare; findings most consistent with lymphoma. 3. Small cavitary nodes in the left posterior cervical triangle are unexpected in the setting of untreated lymphoma. Attention on follow-up. 4. 8 mm pulmonary nodule in the left lower lobe. If the patient is at high risk for bronchogenic carcinoma, follow-up chest CT at 3-30months is recommended. If the patient is at low risk for bronchogenic carcinoma, follow-up chest CT at 6-12 months is recommended. This recommendation follows the consensus statement: Guidelines for Management of Small Pulmonary Nodules Detected on CT Scans: A Statement from the Fleischner Society as published in Radiology 2005; 237:395-400.    Electronically Signed   By: Tiburcio Pea M.D.   On: 11/06/2013 21:29   Ct Abdomen Pelvis W Contrast  11/06/2013   CLINICAL DATA:  New axillary nodes. Question non-Hodgkin's lymphoma.  EXAM: CT NECK, CHEST, ABDOMEN, AND PELVIS WITH CONTRAST  TECHNIQUE: Multidetector CT imaging of the neck, chest, abdomen and pelvis was performed following the standard protocol during bolus administration of intravenous contrast.  CONTRAST:  OMNIPAQUE IOHEXOL 300 MG/ML  SOLN  COMPARISON:  None.  FINDINGS: CT NECK  Diffuse lymphadenopathy. No prior to of establish chronicity. Index nodes:  *Right intra parotid -14 x 9 mm. *Rounded posterior triangle node on the right (image 47) 12 x 6 mm. *Enlarged left submandibular lymph nodes, the larger measuring 17 x 11 mm. *There are 2 low density center nodes in the left posterior triangle, at the level of the hyoid, measuring up to 10 mm in diameter. *Enlarged nodes at the left thoracic inlet, 18 x 9 mm at the tracheoesophageal groove.  Cervical carotid atherosclerosis with at least moderate ICA/bifurcations stenosis. Unremarkable salivary glands and thyroid gland. Bilateral cataract resection. Diffuse degenerative disc and facet disease. No evidence of mass along the surfaces of the aerodigestive tract. Lateral ventriculomegaly, likely from atrophy.  CT CHEST FINDINGS  THORACIC INLET/BODY WALL:  Bulky bilateral axillary lymphadenopathy largest node or node conglomerate in the right axilla measures 3.3 x 3.3 cm. The largest on the left measures 4.8 x 3.1 cm. Left supraclavicular lymphadenopathy, 17 x 9 mm.  MEDIASTINUM:  Normal heart size. No pericardial effusion. Diffuse coronary artery atherosclerosis. Bilateral central pulmonary emboli, lobar size (to the lower lobes and right middle lobe) and segmental or smaller (to the upper lobes). No definitive right heart strain. No acute systemic arterial abnormality seen.  Mediastinal lymphadenopathy with index nodes lower left  paratracheal station 13 mm diameter, and lower right periaortic (image 45) 20 x 16 mm.  LUNG WINDOWS:  No consolidation or effusion. 8 mm pulmonary nodule in the left lower lobe.  OSSEOUS:  No acute fracture.  No suspicious lytic or blastic lesions.  CT ABDOMEN AND PELVIS FINDINGS  BODY WALL: Enlarged bilateral inguinal lymph nodes, largest on the left measuring 33 x 22 mm and on the right 40 x 21 mm.  ABDOMEN/PELVIS:  Liver: No focal abnormality.  Biliary: Cholecystectomy.  Pancreas: Unremarkable.  Spleen: Unremarkable.  Adrenals: Unremarkable.  Kidneys and ureters: No hydronephrosis or stone.  Bladder: Low bladder base consistent with pelvic floor laxity.  Reproductive: Hysterectomy.  Bowel: Colonic diverticulosis, non complicated. No pericecal inflammation. Duodenal diverticulum.  Retroperitoneum: Diffuse retroperitoneal lymphadenopathy, both periaortic, preaortic, bilateral iliac chain (external and internal), and small bowel mesenteric. Index nodes: Left periaortic, infrarenal, 39 x 29 mm (image 73); left  pelvic sidewall, 32 x 19 mm (image 104), right external iliac chain (image 103) 21 mm.  Peritoneum: No free fluid or gas.  Vascular: Extensive atherosclerosis of the aorta and branch vessels, with large irregular plaque in the infrarenal aorta. There is a peripherally calcified 1.6 cm splenic artery aneurysm.  OSSEOUS: No acute abnormalities.  Critical Value/emergent results were called by telephone at the time of interpretation on 11/06/2013 at 9:28 PM to Dr.  Michele Rockers  , who verbally acknowledged these results.  IMPRESSION: 1. Acute pulmonary embolism with bilateral lobar and smaller clot. 2. Diffuse lymphadenopathy: cervical, intrathoracic, axillary, inguinal, and intra-abdominal. No prior imaging to compare; findings most consistent with lymphoma. 3. Small cavitary nodes in the left posterior cervical triangle are unexpected in the setting of untreated lymphoma. Attention on follow-up. 4. 8 mm  pulmonary nodule in the left lower lobe. If the patient is at high risk for bronchogenic carcinoma, follow-up chest CT at 3-64months is recommended. If the patient is at low risk for bronchogenic carcinoma, follow-up chest CT at 6-12 months is recommended. This recommendation follows the consensus statement: Guidelines for Management of Small Pulmonary Nodules Detected on CT Scans: A Statement from the Fleischner Society as published in Radiology 2005; 237:395-400.   Electronically Signed   By: Tiburcio Pea M.D.   On: 11/06/2013 21:29   US Biopsy  11/07/2013   CLINICAL DATA:  79 year old with diffuse lymphadenopathy. Evaluate for neoplasm.  EXAM: ULTRASOUND-GUIDED BIOPSY OF LEFT AXILLARY LYMPH NODE  Physician: Rachelle Hora. Henn, MD  MEDICATIONS: Versed 2 mg, fentanyl 25 mcg. A radiology nurse monitored the patient for moderate sedation.  ANESTHESIA/SEDATION: Moderate sedation time: 10 min  FLUOROSCOPY TIME:  None  PROCEDURE: The procedure was explained to the patient. The risks and benefits of the procedure were discussed and the patient's questions were addressed. Informed consent was obtained from the patient. The patient's left axilla and inguinal regions were evaluated with ultrasound. The left axilla was selected for biopsy. The left axilla region was prepped with Betadine and sterile field was created. The skin was anesthetized with 1% lidocaine. Using ultrasound guidance, 5 core biopsies were obtained within the largest left axillary lymph node with an 18 gauge core device. The specimens were placed in saline. Small bandage was placed over the puncture site.  COMPLICATIONS: None  FINDINGS: There are large hypoechoic lymph nodes in the left axilla. The largest lymph node was selected for biopsy. Needle position was confirmed within the lesion during the core biopsies. There are enlarged bilateral inguinal lymph nodes. Inguinal lymph nodes are more normal appearing than the left axillary lymph nodes.   IMPRESSION: Ultrasound-guided core biopsies of a left axillary lymph node.   Electronically Signed   By: Richarda Overlie M.D.   On: 11/07/2013 16:23   Ir Fluoro Guide Cv Line Right  11/08/2013   INDICATION: History of lymphoma, in need of intravenous access for chemotherapy administration  EXAM: IMPLANTED PORT A CATH PLACEMENT WITH ULTRASOUND AND FLUOROSCOPIC GUIDANCE  MEDICATIONS: Ancef 2 gm IV; IV antibiotic was given in an appropriate time interval prior to skin puncture.  ANESTHESIA/SEDATION: Versed 2 mg IV; Fentanyl 100 mcg IV;  Total Moderate Sedation Time  25  minutes.  CONTRAST:  None  COMPARISON:  Chest and neck CT - 11/06/2013  FLUOROSCOPY TIME:  24 seconds.  PROCEDURE: The procedure, risks, benefits, and alternatives were explained to the patient. Questions regarding the procedure were encouraged and answered. The patient understands and consents to the procedure.  The right neck and chest were prepped with chlorhexidine in a sterile fashion, and a sterile drape was applied covering the operative field. Maximum barrier sterile technique with sterile gowns and gloves were used for the procedure. A timeout was performed prior to the initiation of the procedure. Local anesthesia was provided with 1% lidocaine with epinephrine.  After creating a small venotomy incision, a micropuncture kit was utilized to access the internal jugular vein under direct, real-time ultrasound guidance. Ultrasound image documentation was performed. The microwire was kinked to measure appropriate catheter length.  A subcutaneous port pocket was then created along the upper chest wall utilizing a combination of sharp and blunt dissection. The pocket was irrigated with sterile saline. A single lumen ISP power injectable port was chosen for placement. The 8 Fr catheter was tunneled from the port pocket site to the venotomy incision. The port was placed in the pocket. The external catheter was trimmed to appropriate length. At the  venotomy, an 8 Fr peel-away sheath was placed over a guidewire under fluoroscopic guidance. The catheter was then placed through the sheath and the sheath was removed. Final catheter positioning was confirmed and documented with a fluoroscopic spot radiograph. The port was accessed with a Huber needle, aspirated and flushed with heparinized saline.  The venotomy site was closed with an interrupted 4-0 Vicryl suture. The port pocket incision was closed with interrupted 2-0 Vicryl suture and the skin was opposed with a running subcuticular 4-0 Vicryl suture. Dermabond and Steri-strips were applied to both incisions. Dressings were placed. The patient tolerated the procedure well without immediate post procedural complication.  COMPLICATIONS: None immediate  FINDINGS: After catheter placement, the tip lies within the superior cavoatrial junction. The catheter aspirates and flushes normally and is ready for immediate use.  IMPRESSION: Successful placement of a right internal jugular approach power injectable Port-A-Cath. The catheter is ready for immediate use.   Electronically Signed   By: Simonne Come M.D.   On: 11/08/2013 17:22   Ir US Guide Vasc Access Right  11/08/2013   INDICATION: History of lymphoma, in need of intravenous access for chemotherapy administration  EXAM: IMPLANTED PORT A CATH PLACEMENT WITH ULTRASOUND AND FLUOROSCOPIC GUIDANCE  MEDICATIONS: Ancef 2 gm IV; IV antibiotic was given in an appropriate time interval prior to skin puncture.  ANESTHESIA/SEDATION: Versed 2 mg IV; Fentanyl 100 mcg IV;  Total Moderate Sedation Time  25  minutes.  CONTRAST:  None  COMPARISON:  Chest and neck CT - 11/06/2013  FLUOROSCOPY TIME:  24 seconds.  PROCEDURE: The procedure, risks, benefits, and alternatives were explained to the patient. Questions regarding the procedure were encouraged and answered. The patient understands and consents to the procedure.  The right neck and chest were prepped with chlorhexidine in a  sterile fashion, and a sterile drape was applied covering the operative field. Maximum barrier sterile technique with sterile gowns and gloves were used for the procedure. A timeout was performed prior to the initiation of the procedure. Local anesthesia was provided with 1% lidocaine with epinephrine.  After creating a small venotomy incision, a micropuncture kit was utilized to access the internal jugular vein under direct, real-time ultrasound guidance. Ultrasound image documentation was performed. The microwire was kinked to measure appropriate catheter length.  A subcutaneous port pocket was then created along the upper chest wall utilizing a combination of sharp and blunt dissection. The pocket was irrigated with sterile saline. A single lumen ISP power injectable port was chosen for placement. The 8  Fr catheter was tunneled from the port pocket site to the venotomy incision. The port was placed in the pocket. The external catheter was trimmed to appropriate length. At the venotomy, an 8 Fr peel-away sheath was placed over a guidewire under fluoroscopic guidance. The catheter was then placed through the sheath and the sheath was removed. Final catheter positioning was confirmed and documented with a fluoroscopic spot radiograph. The port was accessed with a Huber needle, aspirated and flushed with heparinized saline.  The venotomy site was closed with an interrupted 4-0 Vicryl suture. The port pocket incision was closed with interrupted 2-0 Vicryl suture and the skin was opposed with a running subcuticular 4-0 Vicryl suture. Dermabond and Steri-strips were applied to both incisions. Dressings were placed. The patient tolerated the procedure well without immediate post procedural complication.  COMPLICATIONS: None immediate  FINDINGS: After catheter placement, the tip lies within the superior cavoatrial junction. The catheter aspirates and flushes normally and is ready for immediate use.  IMPRESSION: Successful  placement of a right internal jugular approach power injectable Port-A-Cath. The catheter is ready for immediate use.   Electronically Signed   By: Simonne Come M.D.   On: 11/08/2013 17:22      Disposition:   Discharge Orders   Future Appointments Provider Department Dept Phone   01/21/2014 1:00 PM Rachael Fee [facility] [phone]   02/06/2014 3:30 PM York Spaniel, MD [facility] [phone]   04/18/2014 1:15 PM Rachael Fee [facility] [phone]   04/18/2014 1:45 PM Josph Macho, MD [facility] [phone]   Future Orders Complete By Expires   De-access Port A Cath  As directed    Discharge patient  As directed    TREATMENT CONDITIONS  As directed    Comments:     Notify the MD for the following lab values: ANC < 1500, PLT < 100K, Hemoglobin < 8.5, Creatinine > 1.5, urine output < 200 ml prior to cisplatin.  If labs are abnormal OR no lab data is available, MD must be notified and order obtained to begin chemotherapy.        Medication List         aspirin 81 MG tablet  Take 81 mg by mouth daily.     atorvastatin 20 MG tablet  Commonly known as:  LIPITOR  Take 20 mg by mouth daily.     bethanechol 10 MG tablet  Commonly known as:  URECHOLINE  Take 1 tablet (10 mg total) by mouth 3 (three) times daily.     famciclovir 500 MG tablet  Commonly known as:  FAMVIR  Take 1 tablet (500 mg total) by mouth daily.     HYDROcodone-acetaminophen 5-325 MG per tablet  Commonly known as:  NORCO/VICODIN  Take 1 tablet by mouth every 6 (six) hours as needed for moderate pain.     levofloxacin 500 MG tablet  Commonly known as:  LEVAQUIN  Take 1 tablet (500 mg total) by mouth at bedtime.     loperamide 2 MG capsule  Commonly known as:  IMODIUM  Take 2 mg by mouth as needed for diarrhea or loose stools.     prochlorperazine 5 MG tablet  Commonly known as:   COMPAZINE  Take 1 tablet (5 mg total) by mouth every 6 (six) hours as needed for nausea or vomiting.     propranolol ER 60 MG 24  hr capsule  Commonly known as:  INDERAL LA  TAKE ONE CAPSULE BY MOUTH TWICE A DAY     Rivaroxaban 15 MG Tabs tablet  Commonly known as:  XARELTO  Take 1 tablet (15 mg total) by mouth 2 (two) times daily with a meal.     Vitamin D (Ergocalciferol) 50000 UNITS Caps capsule  Commonly known as:  DRISDOL  Take 50,000 Units by mouth 2 (two) times a week.            Follow-up Information   Follow up with Josph Macho, MD. (I will see you on 12/30 -- Raiford Noble will call)    Specialty:  Oncology   Contact information:   [address] [place] [address] [phone]       Signed: Marcos Eke 11/14/2013, 9:04 AM   ADDENDUM:  I AGREE WITH THE ABOVE NOTE BY SARA.  SHE HAS A NEW DX OF HIGH GRADE DIFFUSE B-CELL NHL.  SHE HAS ALREADY SHOWN A RESPONSE TO THERAPY WITH REGRESION OF HER NECK NODES.  SHE TOLERATED CHEMO WELL SO FAR.    HER DAUGHTER, KAREN, IS VERY MUCH INVOLVED WITH HER CARE.   SHE AND HER HUSBAND WILL GO TO ASSISTED LIVING.  I WILL SEE HER BACK IN THE OFFICE NEXT WEEK.  PETE E

## 2013-11-15 LAB — URINE CULTURE
Colony Count: >=100000
Special Requests: NORMAL

## 2013-11-16 ENCOUNTER — Telehealth: Payer: Self-pay | Admitting: Hematology & Oncology

## 2013-11-16 ENCOUNTER — Encounter: Payer: Self-pay | Admitting: Nurse Practitioner

## 2013-11-16 NOTE — Progress Notes (Signed)
Paperwork completed for [employer] and faxed back to [phone]. Daughter stated she would pick up the hardcopies on Monday.

## 2013-11-16 NOTE — Telephone Encounter (Signed)
Pt aware of 12-30 appointment

## 2013-11-19 ENCOUNTER — Ambulatory Visit: Payer: Medicare Other | Admitting: Hematology & Oncology

## 2013-11-19 ENCOUNTER — Other Ambulatory Visit: Payer: Medicare Other | Admitting: Lab

## 2013-11-20 ENCOUNTER — Ambulatory Visit: Payer: Medicare Other | Admitting: Hematology & Oncology

## 2013-11-20 ENCOUNTER — Other Ambulatory Visit (HOSPITAL_BASED_OUTPATIENT_CLINIC_OR_DEPARTMENT_OTHER): Payer: Medicare Other | Admitting: Lab

## 2013-11-20 ENCOUNTER — Ambulatory Visit (HOSPITAL_BASED_OUTPATIENT_CLINIC_OR_DEPARTMENT_OTHER): Payer: Medicare Other | Admitting: Hematology & Oncology

## 2013-11-20 ENCOUNTER — Other Ambulatory Visit: Payer: Medicare Other | Admitting: Lab

## 2013-11-20 ENCOUNTER — Ambulatory Visit (HOSPITAL_BASED_OUTPATIENT_CLINIC_OR_DEPARTMENT_OTHER): Payer: Medicare Other

## 2013-11-20 VITALS — BP 89/46 | HR 56 | Temp 97.7°F | Resp 14 | Ht 63.0 in | Wt 177.0 lb

## 2013-11-20 DIAGNOSIS — E440 Moderate protein-calorie malnutrition: Secondary | ICD-10-CM

## 2013-11-20 DIAGNOSIS — C8590 Non-Hodgkin lymphoma, unspecified, unspecified site: Secondary | ICD-10-CM

## 2013-11-20 DIAGNOSIS — C8589 Other specified types of non-Hodgkin lymphoma, extranodal and solid organ sites: Secondary | ICD-10-CM

## 2013-11-20 DIAGNOSIS — D702 Other drug-induced agranulocytosis: Secondary | ICD-10-CM

## 2013-11-20 DIAGNOSIS — F411 Generalized anxiety disorder: Secondary | ICD-10-CM

## 2013-11-20 DIAGNOSIS — F329 Major depressive disorder, single episode, unspecified: Secondary | ICD-10-CM

## 2013-11-20 DIAGNOSIS — R32 Unspecified urinary incontinence: Secondary | ICD-10-CM

## 2013-11-20 LAB — CMP (CANCER CENTER ONLY)
ALT(SGPT): 17 U/L (ref 10–47)
AST: 15 U/L (ref 11–38)
Albumin: 2.8 g/dL — ABNORMAL LOW (ref 3.3–5.5)
CO2: 25 mEq/L (ref 18–33)
Calcium: 9.3 mg/dL (ref 8.0–10.3)
Chloride: 106 mEq/L (ref 98–108)
Potassium: 3.6 mEq/L (ref 3.3–4.7)
Total Protein: 6.6 g/dL (ref 6.4–8.1)

## 2013-11-20 LAB — CBC WITH DIFFERENTIAL (CANCER CENTER ONLY)
BASO#: 0 10*3/uL (ref 0.0–0.2)
Eosinophils Absolute: 0 10*3/uL (ref 0.0–0.5)
HGB: 10.1 g/dL — ABNORMAL LOW (ref 11.6–15.9)
MCH: 33.4 pg (ref 26.0–34.0)
MCHC: 34.6 g/dL (ref 32.0–36.0)
MCV: 97 fL (ref 81–101)
MONO#: 0.2 10*3/uL (ref 0.1–0.9)
MONO%: 22.3 % — ABNORMAL HIGH (ref 0.0–13.0)
NEUT#: 0.1 10*3/uL — CL (ref 1.5–6.5)
NEUT%: 6.4 % — ABNORMAL LOW (ref 39.6–80.0)
Platelets: 250 10*3/uL (ref 145–400)
RBC: 3.02 10*6/uL — ABNORMAL LOW (ref 3.70–5.32)
RDW: 14.2 % (ref 11.1–15.7)
WBC: 0.9 10*3/uL — CL (ref 3.9–10.0)

## 2013-11-20 LAB — URIC ACID: Uric Acid, Serum: 4.9 mg/dL (ref 2.4–7.0)

## 2013-11-20 LAB — LACTATE DEHYDROGENASE: LDH: 214 U/L (ref 94–250)

## 2013-11-20 LAB — TECHNOLOGIST REVIEW CHCC SATELLITE

## 2013-11-20 MED ORDER — DIAZEPAM 5 MG/ML IJ SOLN
2.5000 mg | Freq: Once | INTRAMUSCULAR | Status: DC
Start: 2013-11-20 — End: 2013-11-20

## 2013-11-20 MED ORDER — PEGFILGRASTIM INJECTION 6 MG/0.6ML
SUBCUTANEOUS | Status: AC
  Filled 2013-11-20: qty 0.6

## 2013-11-20 MED ORDER — SODIUM CHLORIDE 0.9 % IV SOLN
INTRAVENOUS | Status: AC
  Administered 2013-11-20: 12:00:00 via INTRAVENOUS

## 2013-11-20 MED ORDER — ESCITALOPRAM OXALATE 10 MG PO TABS: 10.0000 mg | ORAL_TABLET | Freq: Every day | ORAL | Status: DC

## 2013-11-20 MED ORDER — DIAZEPAM 5 MG/ML IJ SOLN
INTRAMUSCULAR | Status: AC
  Filled 2013-11-20: qty 2

## 2013-11-20 MED ORDER — PEGFILGRASTIM INJECTION 6 MG/0.6ML
6.0000 mg | Freq: Once | SUBCUTANEOUS | Status: AC
  Administered 2013-11-20: 6 mg via SUBCUTANEOUS

## 2013-11-20 MED ORDER — MEGESTROL ACETATE 400 MG/10ML PO SUSP: 400.0000 mg | Freq: Every day | ORAL | Status: DC

## 2013-11-20 NOTE — Progress Notes (Signed)
This office note has been dictated.

## 2013-11-21 NOTE — Progress Notes (Signed)
CC:   [name], M.D.  DIAGNOSES: 1. Diffuse large cell non-Hodgkin's lymphoma. 2. Hemochromatosis.  CURRENT THERAPY: 1. The patient is status post 1 cycle of dose reduced R-CHOP. 2. Neulasta for chemotherapy-induced neutropenia.  INTERIM HISTORY:  [name] comes in for her first office visit after we made the diagnosis of lymphoma on her.  We had to admit her back on December 16th.  At that point in time, we noted that she had systemic lymphadenopathy.  She underwent a lymph node core biopsy in the left axilla.  The pathology report ([id number]) showed diffuse large cell non-Hodgkin's lymphoma.  Because she is inpatient, we could not do a PET scan.  Regardless, we went ahead and got a Port-A-Cath put into her.  I forgot to mention that she also was found have pulmonary emboli and DVTs.  We had her on heparin in the hospital.  We then moved her over to Xarelto.  Doing subcutaneous injections would be very difficult for her.  She received chemotherapy without any difficulty.  She got chemotherapy on 11/10/2013.  Again, I reduced the chemotherapy dose.  She responded very nicely.  Her lymphadenopathy has already begun to regress.  She subsequently was discharged on the 24th.  She is now living with her daughter.  She will be moved over to an assisted-living I think in a week.  Unfortunately, she is not eating that much.  She is not drinking that much.  She has some low blood pressure issues.  I am trying to get her to drink and eat a little bit more.  When we had her in the hospital, her prealbumin was I think less than 10.  She has had decrease in her LDH.  When she was admitted, LDH was I think over 700.  When she was discharged, it is down to 347.  She did not have any issues with tumor lysis.  She is on Famvir and Levaquin.  When she was discharged, she did have E. coli urinary tract infection.  She is only on Inderal for her blood pressure and  tachycardia.  She is quite anxious.  We need to get her on something to help with her anxiety.  She has some urinary continence.  This has been common for her.  She is on urecholine for this.  Overall, her performance status right now is ECOG 2 at best.  PHYSICAL EXAMINATION:  General:  This is an elderly, somewhat frail- appearing white female in no obvious distress.  Vital Signs: Temperature of 97.7, pulse 56, respiratory rate 14, blood pressure 90/46, weight is 177 pounds.  Head and Neck:  Normocephalic, atraumatic skull.  There are no ocular or oral lesions.  Neck:  There are no palpable cervical or supraclavicular lymph nodes.  Lungs:  Clear bilaterally.  Cardiac:  Regular rate and rhythm with a normal S1 and S2. There are no murmurs, rubs, or bruits.  Abdomen:  Soft.  She has good bowel sounds.  There is no fluid wave.  There is no palpable hepatosplenomegaly.  Axillary:  Marked decrease in the left axillary lymph node.  Extremities:  Some trace edema in her legs.  Strength is about 4/5 bilaterally.  Skin:  No rashes.  Neurological:  No focal neurological deficits.  LABORATORY STUDIES:  White cell count is 0.9, hemoglobin 10.1, hematocrit 29.2, platelet count 250.  Sodium 140, potassium 3.6, BUN 12, creatinine 0.8.  Glucose 183.  Albumin is 2.8.  IMPRESSION:  [name] is a very charming  79 year old white female with diffuse large cell non-Hodgkin's lymphoma.  This developed very quickly.  She has had 1 cycle of chemotherapy.  She responded nicely to date.  She still has a marginal performance status (ECOG space 2).  We really need to try to improve this.  We will get her on some Megace to try to help with her appetite.  I will also get her on some I think Lexapro to help with her anxiety issues.  She is not due for her next cycle of chemotherapy for another couple weeks.  We will continue to try to improve her performance status.  I will plan to see Ms.  Howell back for her second cycle of chemotherapy.  After her second cycle of chemo, we probably will get a PET scan set up for her even though we could not get one done initially.    ______________________________ [name], M.D. PRE/MEDQ  D:  11/20/2013  T:  11/21/2013  Job:  [id number]

## 2013-11-29 ENCOUNTER — Other Ambulatory Visit: Payer: Medicare Other | Admitting: Lab

## 2013-11-30 ENCOUNTER — Ambulatory Visit (HOSPITAL_BASED_OUTPATIENT_CLINIC_OR_DEPARTMENT_OTHER): Payer: Medicare Other | Admitting: Hematology & Oncology

## 2013-11-30 ENCOUNTER — Other Ambulatory Visit (HOSPITAL_BASED_OUTPATIENT_CLINIC_OR_DEPARTMENT_OTHER): Payer: Medicare Other | Admitting: Lab

## 2013-11-30 ENCOUNTER — Ambulatory Visit (HOSPITAL_BASED_OUTPATIENT_CLINIC_OR_DEPARTMENT_OTHER): Payer: Medicare Other

## 2013-11-30 VITALS — BP 130/60 | HR 80 | Temp 97.6°F | Resp 16 | Ht 63.0 in | Wt 177.0 lb

## 2013-11-30 VITALS — BP 130/60 | HR 80 | Temp 97.5°F | Resp 16

## 2013-11-30 DIAGNOSIS — Z7901 Long term (current) use of anticoagulants: Secondary | ICD-10-CM

## 2013-11-30 DIAGNOSIS — E440 Moderate protein-calorie malnutrition: Secondary | ICD-10-CM

## 2013-11-30 DIAGNOSIS — C8590 Non-Hodgkin lymphoma, unspecified, unspecified site: Secondary | ICD-10-CM

## 2013-11-30 DIAGNOSIS — I2699 Other pulmonary embolism without acute cor pulmonale: Secondary | ICD-10-CM

## 2013-11-30 DIAGNOSIS — C8589 Other specified types of non-Hodgkin lymphoma, extranodal and solid organ sites: Secondary | ICD-10-CM

## 2013-11-30 DIAGNOSIS — Z5111 Encounter for antineoplastic chemotherapy: Secondary | ICD-10-CM

## 2013-11-30 DIAGNOSIS — F329 Major depressive disorder, single episode, unspecified: Secondary | ICD-10-CM

## 2013-11-30 DIAGNOSIS — F419 Anxiety disorder, unspecified: Secondary | ICD-10-CM

## 2013-11-30 DIAGNOSIS — I82409 Acute embolism and thrombosis of unspecified deep veins of unspecified lower extremity: Secondary | ICD-10-CM

## 2013-11-30 DIAGNOSIS — Z5112 Encounter for antineoplastic immunotherapy: Secondary | ICD-10-CM

## 2013-11-30 LAB — CMP (CANCER CENTER ONLY)
ALBUMIN: 2.9 g/dL — AB (ref 3.3–5.5)
ALK PHOS: 135 U/L — AB (ref 26–84)
ALT: 10 U/L (ref 10–47)
AST: 11 U/L (ref 11–38)
BUN, Bld: 9 mg/dL (ref 7–22)
CO2: 24 mEq/L (ref 18–33)
Calcium: 9.1 mg/dL (ref 8.0–10.3)
Chloride: 108 mEq/L (ref 98–108)
Creat: 0.6 mg/dl (ref 0.6–1.2)
Glucose, Bld: 165 mg/dL — ABNORMAL HIGH (ref 73–118)
Potassium: 3.2 mEq/L — ABNORMAL LOW (ref 3.3–4.7)
Sodium: 141 mEq/L (ref 128–145)
Total Bilirubin: 0.7 mg/dl (ref 0.20–1.60)
Total Protein: 6.6 g/dL (ref 6.4–8.1)

## 2013-11-30 LAB — CBC WITH DIFFERENTIAL (CANCER CENTER ONLY)
BASO#: 0.2 10*3/uL (ref 0.0–0.2)
BASO%: 0.4 % (ref 0.0–2.0)
EOS ABS: 0 10*3/uL (ref 0.0–0.5)
EOS%: 0.1 % (ref 0.0–7.0)
HCT: 29.4 % — ABNORMAL LOW (ref 34.8–46.6)
HEMOGLOBIN: 10.1 g/dL — AB (ref 11.6–15.9)
LYMPH#: 2.8 10*3/uL (ref 0.9–3.3)
LYMPH%: 6.1 % — ABNORMAL LOW (ref 14.0–48.0)
MCH: 34.7 pg — ABNORMAL HIGH (ref 26.0–34.0)
MCHC: 34.4 g/dL (ref 32.0–36.0)
MCV: 101 fL (ref 81–101)
MONO#: 2.3 10*3/uL — AB (ref 0.1–0.9)
MONO%: 5.1 % (ref 0.0–13.0)
NEUT#: 40.5 10*3/uL — ABNORMAL HIGH (ref 1.5–6.5)
NEUT%: 88.3 % — ABNORMAL HIGH (ref 39.6–80.0)
PLATELETS: 326 10*3/uL (ref 145–400)
RBC: 2.91 10*6/uL — AB (ref 3.70–5.32)
RDW: 17.8 % — ABNORMAL HIGH (ref 11.1–15.7)
WBC: 45.9 10*3/uL — ABNORMAL HIGH (ref 3.9–10.0)

## 2013-11-30 LAB — URIC ACID: URIC ACID, SERUM: 7.4 mg/dL — AB (ref 2.4–7.0)

## 2013-11-30 LAB — LACTATE DEHYDROGENASE: LDH: 342 U/L — AB (ref 94–250)

## 2013-11-30 LAB — PREALBUMIN: Prealbumin: 13.9 mg/dL — ABNORMAL LOW (ref 17.0–34.0)

## 2013-11-30 LAB — TECHNOLOGIST REVIEW CHCC SATELLITE

## 2013-11-30 MED ORDER — HEPARIN SOD (PORK) LOCK FLUSH 100 UNIT/ML IV SOLN
500.0000 [IU] | Freq: Once | INTRAVENOUS | Status: DC | PRN
  Filled 2013-11-30: qty 5

## 2013-11-30 MED ORDER — ACETAMINOPHEN 325 MG PO TABS
650.0000 mg | ORAL_TABLET | Freq: Once | ORAL | Status: AC
  Administered 2013-11-30: 650 mg via ORAL

## 2013-11-30 MED ORDER — DEXAMETHASONE SODIUM PHOSPHATE 20 MG/5ML IJ SOLN
20.0000 mg | Freq: Once | INTRAMUSCULAR | Status: AC
  Administered 2013-11-30: 20 mg via INTRAVENOUS

## 2013-11-30 MED ORDER — SODIUM CHLORIDE 0.9 % IV SOLN
Freq: Once | INTRAVENOUS | Status: AC
  Administered 2013-11-30: 09:00:00 via INTRAVENOUS

## 2013-11-30 MED ORDER — DIPHENHYDRAMINE HCL 25 MG PO CAPS
50.0000 mg | ORAL_CAPSULE | Freq: Once | ORAL | Status: AC
  Administered 2013-11-30: 50 mg via ORAL

## 2013-11-30 MED ORDER — VINCRISTINE SULFATE CHEMO INJECTION 1 MG/ML
1.0000 mg | Freq: Once | INTRAVENOUS | Status: AC
  Administered 2013-11-30: 1 mg via INTRAVENOUS
  Filled 2013-11-30: qty 1

## 2013-11-30 MED ORDER — SODIUM CHLORIDE 0.9 % IJ SOLN
10.0000 mL | INTRAMUSCULAR | Status: DC | PRN
  Filled 2013-11-30: qty 10

## 2013-11-30 MED ORDER — DEXAMETHASONE SODIUM PHOSPHATE 20 MG/5ML IJ SOLN
INTRAMUSCULAR | Status: AC
  Filled 2013-11-30: qty 5

## 2013-11-30 MED ORDER — SODIUM CHLORIDE 0.9 % IV SOLN
375.0000 mg/m2 | Freq: Once | INTRAVENOUS | Status: AC
  Administered 2013-11-30: 700 mg via INTRAVENOUS
  Filled 2013-11-30: qty 70

## 2013-11-30 MED ORDER — ONDANSETRON 16 MG/50ML IVPB (CHCC)
INTRAVENOUS | Status: AC
  Filled 2013-11-30: qty 16

## 2013-11-30 MED ORDER — ONDANSETRON 16 MG/50ML IVPB (CHCC)
16.0000 mg | Freq: Once | INTRAVENOUS | Status: AC
  Administered 2013-11-30: 16 mg via INTRAVENOUS

## 2013-11-30 MED ORDER — DOXORUBICIN HCL CHEMO IV INJECTION 2 MG/ML
40.0000 mg/m2 | Freq: Once | INTRAVENOUS | Status: AC
  Administered 2013-11-30: 78 mg via INTRAVENOUS
  Filled 2013-11-30: qty 39

## 2013-11-30 MED ORDER — DIPHENHYDRAMINE HCL 25 MG PO CAPS
ORAL_CAPSULE | ORAL | Status: AC
  Filled 2013-11-30: qty 2

## 2013-11-30 MED ORDER — SODIUM CHLORIDE 0.9 % IV SOLN
600.0000 mg/m2 | Freq: Once | INTRAVENOUS | Status: AC
  Administered 2013-11-30: 1160 mg via INTRAVENOUS
  Filled 2013-11-30: qty 58

## 2013-11-30 MED ORDER — RIVAROXABAN 20 MG PO TABS: 20.0000 mg | ORAL_TABLET | Freq: Every day | ORAL | Status: DC

## 2013-11-30 MED ORDER — ACETAMINOPHEN 325 MG PO TABS
ORAL_TABLET | ORAL | Status: AC
  Filled 2013-11-30: qty 2

## 2013-11-30 NOTE — Progress Notes (Signed)
This office note has been dictated.

## 2013-11-30 NOTE — Patient Instructions (Addendum)
[facility] Discharge Instructions for Patients Receiving Chemotherapy  Today you received the following chemotherapy agents Rituxan, Cytoxan, Adriamycin, Vincristine  To help prevent nausea and vomiting after your treatment, we encourage you to take your nausea medication:  Place Sancuso patch to skin on 12/01/13. Remove after 7 days. This is a long acting nausea medication.  Compazine (prochlorperazine) 10 mg every 6 hours as needed for breakthrough nausea.    If you develop nausea and vomiting that is not controlled by your nausea medication, call the clinic.   BELOW ARE SYMPTOMS THAT SHOULD BE REPORTED IMMEDIATELY:  *FEVER GREATER THAN 100.5 F  *CHILLS WITH OR WITHOUT FEVER  NAUSEA AND VOMITING THAT IS NOT CONTROLLED WITH YOUR NAUSEA MEDICATION  *UNUSUAL SHORTNESS OF BREATH  *UNUSUAL BRUISING OR BLEEDING  TENDERNESS IN MOUTH AND THROAT WITH OR WITHOUT PRESENCE OF ULCERS  *URINARY PROBLEMS  *BOWEL PROBLEMS  UNUSUAL RASH Items with * indicate a potential emergency and should be followed up as soon as possible.  Feel free to call the clinic you have any questions or concerns. The clinic phone number is [phone].

## 2013-12-01 ENCOUNTER — Other Ambulatory Visit: Payer: Self-pay | Admitting: Hematology and Oncology

## 2013-12-01 ENCOUNTER — Ambulatory Visit (HOSPITAL_BASED_OUTPATIENT_CLINIC_OR_DEPARTMENT_OTHER): Payer: Medicare Other

## 2013-12-01 VITALS — BP 123/47 | HR 79 | Temp 98.1°F | Resp 18

## 2013-12-01 DIAGNOSIS — C8589 Other specified types of non-Hodgkin lymphoma, extranodal and solid organ sites: Secondary | ICD-10-CM

## 2013-12-01 DIAGNOSIS — C8590 Non-Hodgkin lymphoma, unspecified, unspecified site: Secondary | ICD-10-CM

## 2013-12-01 MED ORDER — PEGFILGRASTIM INJECTION 6 MG/0.6ML
6.0000 mg | Freq: Once | SUBCUTANEOUS | Status: AC
  Administered 2013-12-01: 6 mg via SUBCUTANEOUS

## 2013-12-01 MED ORDER — PREDNISONE 20 MG PO TABS
60.0000 mg | ORAL_TABLET | Freq: Every day | ORAL | Status: DC
Start: 2013-12-01 — End: 2013-12-09

## 2013-12-01 NOTE — Patient Instructions (Signed)

## 2013-12-01 NOTE — Progress Notes (Signed)
CC:   Wendy Bunting, MD  DIAGNOSES: 1. Diffuse large cell non-Hodgkin lymphoma. 2. Hemochromatosis. 3. Pulmonary embolism/bilateral deep venous thrombosis-likely     secondary to malignancy.  CURRENT THERAPY: 1. The patient is status post cycle 1 of R-CHOP. 2. Xarelto 20 mg p.o. daily. 3. Neulasta for chemotherapy-induced neutropenia.  INTERIM HISTORY:  [name] comes in for a followup.  This is her second office visit since her discharged from the hospital.  She seems to be improving slowly.  She is at assisted living now.  She and her husband seem to be doing okay there.  She does do some physical therapy.  She gets very anxious.  When she gets anxious, she gets short of breath. I told her that the shortness of breath will take a while to improve because of blood clots.  I told her that the more she is able to walk, the better her lung function will get.  She has had really no problems with nausea or vomiting from the chemotherapy.  She has had no bleeding with her being on Xarelto for the thromboembolic disease.  PHYSICAL EXAMINATION:  General:  This is an elderly, somewhat pale- appearing white female in no obvious distress.  Vital Signs: Temperature of 97.5, pulse 80, respiratory rate 16, blood pressure 130/60, weight is 177 pounds.  Head and Neck:  Normocephalic, atraumatic skull.  There are no ocular or oral lesions.  There are no palpable cervical or supraclavicular lymph nodes.  Lungs:  Clear bilaterally. She has no rales, wheezes, or rhonchi.  Cardiac:  Regular rate and rhythm with a normal S1, S2.  She has no murmurs, rubs, or bruits. Abdomen:  Soft.  She has good bowel sounds.  There is no fluid wave. There is no palpable hepatosplenomegaly.  Axillary:  Shows no bilateral axillary adenopathy.  Extremities:  Show maybe some trace edema in her legs.  Skin:  No rashes.  Neurological:  No focal neurological deficits.  LABORATORY STUDIES:  White cell count is  46 (from Neulasta), hemoglobin 10.1, hematocrit 29.4, platelet count 326.  Sodium 141, potassium 3.2, creatinine 0.6.  BUN is 2.9.  Prealbumin is 13.9.  IMPRESSION:  [name] is a very charming 80 year old white female. She has a hemochromatosis.  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 She then should have with diffuse large cell lymphoma.  She has responded to 1 cycle of chemotherapy.  She has no palpable lymphadenopathy.  We will now get her set with a CT scan.  We cannot get a PET scan on her initially because of her being hospitalized.  I think, a CT scan is good enough of what we need.  We are giving a little bit dose of reduction of chemotherapy.  Again, she has responded nicely clinically.  I expect that her CT scan should show resolution of her lymphadenopathy.  I will plan to get her back in 3 weeks.  I spent about an hour with her today.  She has a lot of issues going on that I had to address.  I reassured her.  I make sure that she is taking her medication properly, particularly the Xarelto.  I gave her daughter the scan and path results that she had back in the hospital.    ______________________________ Volanda Napoleon, M.D. PRE/MEDQ  D:  11/30/2013  T:  12/01/2013  Job:  [id number]

## 2013-12-03 ENCOUNTER — Telehealth: Payer: Self-pay | Admitting: Hematology & Oncology

## 2013-12-03 NOTE — Telephone Encounter (Signed)
Pt aware of 1-28 CT

## 2013-12-03 NOTE — Telephone Encounter (Signed)
Left Santiago Glad message to call and schedule CT

## 2013-12-04 ENCOUNTER — Encounter: Payer: Self-pay | Admitting: Nurse Practitioner

## 2013-12-04 NOTE — Progress Notes (Signed)
Received notification from [place] that daughter declined the need for OT at this time, as she is an OT and stated she has been working with her mother.

## 2013-12-07 ENCOUNTER — Inpatient Hospital Stay: Admission: AD | Admit: 2013-12-07 | Payer: Medicare Other | Source: Ambulatory Visit | Admitting: Hematology & Oncology

## 2013-12-07 ENCOUNTER — Encounter: Payer: Self-pay | Admitting: Nurse Practitioner

## 2013-12-07 NOTE — Progress Notes (Signed)
Pt's daughter states her mom will not eat or drink anything, this has been going on for the past 2-3 days. Today pt is weak, fragile, and unable to walk. Unfortunately at this time she is going to have to be readmitted as Dr. Marin Olp feels that she is probably neutropenic, very dehydrated, and weak. Daughter is aware of the plan. Pt is s/p 6 days from chemotherapy treatment.Marland Kitchen

## 2013-12-09 ENCOUNTER — Emergency Department (HOSPITAL_COMMUNITY): Payer: Medicare Other

## 2013-12-09 ENCOUNTER — Encounter (HOSPITAL_COMMUNITY): Payer: Self-pay | Admitting: Emergency Medicine

## 2013-12-09 ENCOUNTER — Inpatient Hospital Stay (HOSPITAL_COMMUNITY)
Admission: EM | Admit: 2013-12-09 | Discharge: 2013-12-20 | DRG: 802 | Disposition: A | Discharge status: Hospice | Payer: Medicare Other | Attending: Hematology & Oncology | Admitting: Hematology & Oncology

## 2013-12-09 DIAGNOSIS — R32 Unspecified urinary incontinence: Secondary | ICD-10-CM | POA: Diagnosis present

## 2013-12-09 DIAGNOSIS — R627 Adult failure to thrive: Secondary | ICD-10-CM

## 2013-12-09 DIAGNOSIS — D709 Neutropenia, unspecified: Secondary | ICD-10-CM

## 2013-12-09 DIAGNOSIS — Z9849 Cataract extraction status, unspecified eye: Secondary | ICD-10-CM

## 2013-12-09 DIAGNOSIS — Z6830 Body mass index (BMI) 30.0-30.9, adult: Secondary | ICD-10-CM

## 2013-12-09 DIAGNOSIS — E876 Hypokalemia: Secondary | ICD-10-CM

## 2013-12-09 DIAGNOSIS — Z7982 Long term (current) use of aspirin: Secondary | ICD-10-CM

## 2013-12-09 DIAGNOSIS — R197 Diarrhea, unspecified: Secondary | ICD-10-CM | POA: Diagnosis present

## 2013-12-09 DIAGNOSIS — N390 Urinary tract infection, site not specified: Secondary | ICD-10-CM

## 2013-12-09 DIAGNOSIS — Z515 Encounter for palliative care: Secondary | ICD-10-CM

## 2013-12-09 DIAGNOSIS — I2782 Chronic pulmonary embolism: Secondary | ICD-10-CM | POA: Diagnosis present

## 2013-12-09 DIAGNOSIS — E43 Unspecified severe protein-calorie malnutrition: Secondary | ICD-10-CM

## 2013-12-09 DIAGNOSIS — C8589 Other specified types of non-Hodgkin lymphoma, extranodal and solid organ sites: Secondary | ICD-10-CM | POA: Diagnosis present

## 2013-12-09 DIAGNOSIS — T451X5A Adverse effect of antineoplastic and immunosuppressive drugs, initial encounter: Secondary | ICD-10-CM | POA: Diagnosis present

## 2013-12-09 DIAGNOSIS — J029 Acute pharyngitis, unspecified: Secondary | ICD-10-CM | POA: Diagnosis not present

## 2013-12-09 DIAGNOSIS — I7090 Unspecified atherosclerosis: Secondary | ICD-10-CM | POA: Diagnosis present

## 2013-12-09 DIAGNOSIS — I82509 Chronic embolism and thrombosis of unspecified deep veins of unspecified lower extremity: Secondary | ICD-10-CM | POA: Diagnosis present

## 2013-12-09 DIAGNOSIS — E860 Dehydration: Secondary | ICD-10-CM

## 2013-12-09 DIAGNOSIS — R5383 Other fatigue: Secondary | ICD-10-CM

## 2013-12-09 DIAGNOSIS — E872 Acidosis, unspecified: Secondary | ICD-10-CM | POA: Diagnosis present

## 2013-12-09 DIAGNOSIS — R29898 Other symptoms and signs involving the musculoskeletal system: Secondary | ICD-10-CM

## 2013-12-09 DIAGNOSIS — D630 Anemia in neoplastic disease: Secondary | ICD-10-CM | POA: Diagnosis present

## 2013-12-09 DIAGNOSIS — D72819 Decreased white blood cell count, unspecified: Principal | ICD-10-CM | POA: Diagnosis present

## 2013-12-09 DIAGNOSIS — M1990 Unspecified osteoarthritis, unspecified site: Secondary | ICD-10-CM | POA: Diagnosis present

## 2013-12-09 DIAGNOSIS — D649 Anemia, unspecified: Secondary | ICD-10-CM | POA: Diagnosis present

## 2013-12-09 DIAGNOSIS — F411 Generalized anxiety disorder: Secondary | ICD-10-CM

## 2013-12-09 DIAGNOSIS — R531 Weakness: Secondary | ICD-10-CM

## 2013-12-09 DIAGNOSIS — I959 Hypotension, unspecified: Secondary | ICD-10-CM | POA: Diagnosis present

## 2013-12-09 DIAGNOSIS — R5381 Other malaise: Secondary | ICD-10-CM | POA: Diagnosis present

## 2013-12-09 DIAGNOSIS — C8590 Non-Hodgkin lymphoma, unspecified, unspecified site: Secondary | ICD-10-CM

## 2013-12-09 DIAGNOSIS — F0280 Dementia in other diseases classified elsewhere without behavioral disturbance: Secondary | ICD-10-CM | POA: Diagnosis present

## 2013-12-09 DIAGNOSIS — E440 Moderate protein-calorie malnutrition: Secondary | ICD-10-CM

## 2013-12-09 DIAGNOSIS — Z7901 Long term (current) use of anticoagulants: Secondary | ICD-10-CM

## 2013-12-09 DIAGNOSIS — Z79899 Other long term (current) drug therapy: Secondary | ICD-10-CM

## 2013-12-09 DIAGNOSIS — F410 Panic disorder [episodic paroxysmal anxiety] without agoraphobia: Secondary | ICD-10-CM | POA: Diagnosis present

## 2013-12-09 DIAGNOSIS — Z66 Do not resuscitate: Secondary | ICD-10-CM | POA: Diagnosis present

## 2013-12-09 DIAGNOSIS — I10 Essential (primary) hypertension: Secondary | ICD-10-CM | POA: Diagnosis present

## 2013-12-09 DIAGNOSIS — I82891 Chronic embolism and thrombosis of other specified veins: Secondary | ICD-10-CM | POA: Diagnosis present

## 2013-12-09 HISTORY — DX: Unspecified severe protein-calorie malnutrition: E43

## 2013-12-09 HISTORY — DX: Non-Hodgkin lymphoma, unspecified, unspecified site: C85.90

## 2013-12-09 LAB — URINALYSIS, ROUTINE W REFLEX MICROSCOPIC
BILIRUBIN URINE: NEGATIVE
GLUCOSE, UA: NEGATIVE mg/dL
Hgb urine dipstick: NEGATIVE
Ketones, ur: NEGATIVE mg/dL
LEUKOCYTES UA: NEGATIVE
Nitrite: NEGATIVE
PH: 8 (ref 5.0–8.0)
Protein, ur: NEGATIVE mg/dL
Specific Gravity, Urine: 1.012 (ref 1.005–1.030)
Urobilinogen, UA: 1 mg/dL (ref 0.0–1.0)

## 2013-12-09 LAB — CBC WITH DIFFERENTIAL/PLATELET
Basophils Absolute: 0 10*3/uL (ref 0.0–0.1)
Basophils Relative: 1 % (ref 0–1)
EOS PCT: 1 % (ref 0–5)
Eosinophils Absolute: 0 10*3/uL (ref 0.0–0.7)
HCT: 22.1 % — ABNORMAL LOW (ref 36.0–46.0)
HEMOGLOBIN: 7.9 g/dL — AB (ref 12.0–15.0)
Lymphocytes Relative: 30 % (ref 12–46)
Lymphs Abs: 0.4 10*3/uL — ABNORMAL LOW (ref 0.7–4.0)
MCH: 34.5 pg — ABNORMAL HIGH (ref 26.0–34.0)
MCHC: 35.7 g/dL (ref 30.0–36.0)
MCV: 96.5 fL (ref 78.0–100.0)
MONOS PCT: 15 % — AB (ref 3–12)
Monocytes Absolute: 0.2 10*3/uL (ref 0.1–1.0)
NEUTROS PCT: 53 % (ref 43–77)
Neutro Abs: 0.6 10*3/uL — ABNORMAL LOW (ref 1.7–7.7)
Platelets: 201 10*3/uL (ref 150–400)
RBC: 2.29 MIL/uL — AB (ref 3.87–5.11)
RDW: 16.9 % — ABNORMAL HIGH (ref 11.5–15.5)
WBC: 1.2 10*3/uL — AB (ref 4.0–10.5)

## 2013-12-09 LAB — COMPREHENSIVE METABOLIC PANEL
ALBUMIN: 2.7 g/dL — AB (ref 3.5–5.2)
ALK PHOS: 90 U/L (ref 39–117)
ALT: 8 U/L (ref 0–35)
AST: 7 U/L (ref 0–37)
BUN: 11 mg/dL (ref 6–23)
CO2: 17 mEq/L — ABNORMAL LOW (ref 19–32)
Calcium: 8.4 mg/dL (ref 8.4–10.5)
Chloride: 104 mEq/L (ref 96–112)
Creatinine, Ser: 0.63 mg/dL (ref 0.50–1.10)
GFR calc Af Amer: >90 mL/min (ref >90)
GFR calc non Af Amer: 82 mL/min — ABNORMAL LOW (ref >90)
Glucose, Bld: 175 mg/dL — ABNORMAL HIGH (ref 70–99)
POTASSIUM: 3.5 meq/L — AB (ref 3.7–5.3)
SODIUM: 139 meq/L (ref 137–147)
TOTAL PROTEIN: 5.5 g/dL — AB (ref 6.0–8.3)
Total Bilirubin: 0.7 mg/dL (ref 0.3–1.2)

## 2013-12-09 LAB — CG4 I-STAT (LACTIC ACID): Lactic Acid, Venous: 3.27 mmol/L — ABNORMAL HIGH (ref 0.5–2.2)

## 2013-12-09 LAB — GLUCOSE, CAPILLARY: Glucose-Capillary: 177 mg/dL — ABNORMAL HIGH (ref 70–99)

## 2013-12-09 MED ORDER — SODIUM CHLORIDE 0.9 % IV SOLN: INTRAVENOUS | Status: DC

## 2013-12-09 MED ORDER — SODIUM CHLORIDE 0.9 % IV BOLUS (SEPSIS)
1000.0000 mL | Freq: Once | INTRAVENOUS | Status: AC
  Administered 2013-12-09: 1000 mL via INTRAVENOUS

## 2013-12-09 MED ORDER — LEVOFLOXACIN 750 MG PO TABS
750.0000 mg | ORAL_TABLET | Freq: Every day | ORAL | Status: DC
  Administered 2013-12-10: 750 mg via ORAL
  Filled 2013-12-09 (×2): qty 1

## 2013-12-09 MED ORDER — LEVOFLOXACIN IN D5W 750 MG/150ML IV SOLN
750.0000 mg | Freq: Once | INTRAVENOUS | Status: AC
  Administered 2013-12-10: 750 mg via INTRAVENOUS
  Filled 2013-12-09: qty 150

## 2013-12-09 NOTE — ED Notes (Signed)
Upon standing pt for last set of orthostatic vital signs pt unable to stay standing due to dizziness

## 2013-12-09 NOTE — ED Notes (Addendum)
During linen change, skin breakdown was noted on the bottom. Between buttocks, Stage I area 4cm x 6cm noted. Area was reddened, nonblancheable and non-tender to the touch with a Stage II Pea sized Pressure ulcer in the center.

## 2013-12-09 NOTE — ED Notes (Signed)
Pt attempted to void but was unable to at this time. Pt is aware of need of urine sample

## 2013-12-09 NOTE — ED Provider Notes (Signed)
CSN: [id number]     Arrival date & time 12/09/13  1516 History   First MD Initiated Contact with Patient 12/09/13 1523     Chief Complaint  Patient presents with  . Weakness   (Consider location/radiation/quality/duration/timing/severity/associated sxs/prior Treatment) HPI Comments: 80 year old female with history of non-Hodgkin's lymphoma currently on chemotherapy presents with weakness over the last few days. 2 days ago she felt weak was able to aggressively rehydrate orally and felt better. However the last 2 days the daughter states patient has been having decreased by mouth intake, mostly due to nausea possibly some from forgetting his become progressively more weak. She's also complained of dizziness, especially with standing. Patient not had any fevers. The patient has not had any cough or new shortness of breath. She has a history of prior PE but denies shortness but this time. No chest pain. No focal weakness. The daughter notes patient is prone to bladder infections. She is 9 days status post her most recent round of chemotherapy. She has a port in place but has not felt hot, red, or painful. No diarrhea. The daughter noted the patient had a blood pressure of 90/50 at her facility. Normally she is around 237 or 628 systolic.   Past Medical History  Diagnosis Date  . Hemochromatosis, hereditary 01/14/2012  . Occasional tremors   . PONV (postoperative nausea and vomiting)     also hard to wake up  . Non Hodgkin's lymphoma    Past Surgical History  Procedure Laterality Date  . Abdominal hysterectomy    . 3 bladder surgeries    . Cholecystectomy    . Cataract extraction, bilateral    . Joint replacement      bilateral knees  . Left ankle surgery    . Tonsillectomy     No family history on file. History  Substance Use Topics  . Smoking status: Never Smoker   . Smokeless tobacco: Never Used  . Alcohol Use: No   OB History   Grav Para Term Preterm Abortions TAB SAB Ect Mult  Living                 Review of Systems  Constitutional: Negative for fever and chills.  Respiratory: Negative for cough and shortness of breath.   Cardiovascular: Negative for chest pain.  Gastrointestinal: Positive for nausea. Negative for vomiting, abdominal pain and diarrhea.  Genitourinary: Negative for dysuria.  Neurological: Positive for dizziness and weakness.  All other systems reviewed and are negative.    Allergies  Review of patient's allergies indicates no known allergies.  Home Medications   Current Outpatient Rx  Name  Route  Sig  Dispense  Refill  . aspirin 81 MG tablet   Oral   Take 81 mg by mouth daily.         Marland Kitchen atorvastatin (LIPITOR) 20 MG tablet   Oral   Take 20 mg by mouth daily.         . bethanechol (URECHOLINE) 10 MG tablet   Oral   Take 1 tablet (10 mg total) by mouth 3 (three) times daily.   90 tablet   2   . escitalopram (LEXAPRO) 10 MG tablet   Oral   Take 1 tablet (10 mg total) by mouth daily.   30 tablet   6   . famciclovir (FAMVIR) 500 MG tablet   Oral   Take 1 tablet (500 mg total) by mouth daily.   30 tablet   6   .  loperamide (IMODIUM) 2 MG capsule   Oral   Take 2 mg by mouth as needed for diarrhea or loose stools.         . megestrol (MEGACE) 400 MG/10ML suspension   Oral   Take 10 mLs (400 mg total) by mouth daily.   240 mL   2   . predniSONE (DELTASONE) 20 MG tablet   Oral   Take 3 tablets (60 mg total) by mouth daily with breakfast. Take 60 mg PO X 5 days with breakfast every 21 days   60 tablet   1   . prochlorperazine (COMPAZINE) 5 MG tablet   Oral   Take 1 tablet (5 mg total) by mouth every 6 (six) hours as needed for nausea or vomiting.   30 tablet   0   . propranolol ER (INDERAL LA) 60 MG 24 hr capsule      TAKE ONE CAPSULE BY MOUTH TWICE A DAY   180 capsule   1   . Rivaroxaban (XARELTO) 20 MG TABS tablet   Oral   Take 1 tablet (20 mg total) by mouth daily with supper.   30 tablet   5    . Vitamin D, Ergocalciferol, (DRISDOL) 50000 UNITS CAPS capsule   Oral   Take 50,000 Units by mouth 2 (two) times a week.          BP 122/44  Pulse 76  Temp(Src) 97.9 F (36.6 C) (Oral)  Resp 16  SpO2 100% Physical Exam  Nursing note and vitals reviewed. Constitutional: She is oriented to person, place, and time. She appears well-developed and well-nourished.  HENT:  Head: Normocephalic and atraumatic.  Right Ear: External ear normal.  Left Ear: External ear normal.  Nose: Nose normal.  Dry lips  Eyes: Right eye exhibits no discharge. Left eye exhibits no discharge.  Cardiovascular: Normal rate, regular rhythm and normal heart sounds.   Pulmonary/Chest: Effort normal and breath sounds normal.  Abdominal: Soft. She exhibits no distension. There is no tenderness.  Neurological: She is alert and oriented to person, place, and time.  Equal strength bilaterally in all 4 extremities  Skin: Skin is warm and dry.    ED Course  Procedures (including critical care time) Labs Review Labs Reviewed  CBC WITH DIFFERENTIAL - Abnormal; Notable for the following:    WBC 1.2 (*)    RBC 2.29 (*)    Hemoglobin 7.9 (*)    HCT 22.1 (*)    MCH 34.5 (*)    RDW 16.9 (*)    Monocytes Relative 15 (*)    Neutro Abs 0.6 (*)    Lymphs Abs 0.4 (*)    All other components within normal limits  COMPREHENSIVE METABOLIC PANEL - Abnormal; Notable for the following:    Potassium 3.5 (*)    CO2 17 (*)    Glucose, Bld 175 (*)    Total Protein 5.5 (*)    Albumin 2.7 (*)    GFR calc non Af Amer 82 (*)    All other components within normal limits  URINALYSIS, ROUTINE W REFLEX MICROSCOPIC - Abnormal; Notable for the following:    APPearance CLOUDY (*)    All other components within normal limits  GLUCOSE, CAPILLARY - Abnormal; Notable for the following:    Glucose-Capillary 177 (*)    All other components within normal limits  CG4 I-STAT (LACTIC ACID) - Abnormal; Notable for the following:     Lactic Acid, Venous 3.27 (*)    All  other components within normal limits  CULTURE, BLOOD (ROUTINE X 2)  CULTURE, BLOOD (ROUTINE X 2)  PATHOLOGIST SMEAR REVIEW  BASIC METABOLIC PANEL  CBC WITH DIFFERENTIAL   Imaging Review Dg Chest 2 View  12/09/2013   CLINICAL DATA:  Weakness and shortness of breath.  EXAM: CHEST  2 VIEW  COMPARISON:  Chest x-ray 02/27/2009.  FINDINGS: Elevation of the left hemidiaphragm. Lung volumes are slightly low. No consolidative airspace disease. No pleural effusions. No suspicious appearing pulmonary nodules or masses. No evidence of pulmonary edema. Heart size is normal. The patient is rotated to the left on today's exam, resulting in distortion of the mediastinal contours and reduced diagnostic sensitivity and specificity for mediastinal pathology. Atherosclerosis in the thoracic aorta. Right internal jugular single-lumen power porta cath with tip terminating in the mid superior vena cava.  IMPRESSION: 1. No radiographic evidence of acute cardiopulmonary disease. 2. Atherosclerosis. 3. Mild elevation of the left hemidiaphragm.   Electronically Signed   By: Vinnie Langton M.D.   On: 12/09/2013 16:00    EKG Interpretation    Date/Time:  Sunday December 09 2013 15:43:21 EST Ventricular Rate:  71 PR Interval:  135 QRS Duration: 92 QT Interval:  428 QTC Calculation: 465 R Axis:   57 Text Interpretation:  Sinus rhythm Borderline T wave abnormalities Confirmed by Modine Oppenheimer  MD, Oluwatoyin Banales ([id number]) on 12/09/2013 3:46:48 PM            MDM   1. Weakness   2. Hemochromatosis, hereditary   3. Neutropenia   4. NHL (non-Hodgkin's lymphoma)   5. Weakness of both lower limbs    Patient appears dehydrated with dry mucous membranes and appears symptomatic given history. Nearly neutropenic with 600 PMNs. No fevers. Improved with fluids, but given lactic acidosis and neutropenia, feel she should get more fluids and treatment in hospitalist. D/w oncology (Dr. Earlie Server), who  recommends levaquin given her neutropenia w/o fever.     Ephraim Hamburger, MD 12/10/13 [phone]

## 2013-12-09 NOTE — ED Notes (Signed)
RN at bedside accessing port at this time. Will obtain orthostatic vital signs after procedure is done

## 2013-12-09 NOTE — ED Notes (Addendum)
Pt resides at [facility]. Pt developed weakness and dizziness this am. Pt recently dx with non-hodgkin lymphoma on Jan 1st.

## 2013-12-09 NOTE — ED Notes (Signed)
Patient brought to ED via EMS. Upon entering the room patient complains of weakness, diarrhea, nausea, and dizziness. Patient denies any pain at this time. Granddaughter at bedside. Denies any needs at this time. Will continue to monitor.

## 2013-12-09 NOTE — H&P (Signed)
Patient Demographics  [name], is a 80 y.o. female  MRN: [id number]   DOB - 1931-04-23  Admit Date - 12/09/2013  Outpatient Primary MD for the patient is ARONSON,RICHARD A, MD   With History of -  Past Medical History  Diagnosis Date  . Hemochromatosis, hereditary 01/14/2012  . Occasional tremors   . PONV (postoperative nausea and vomiting)     also hard to wake up  . Non Hodgkin's lymphoma       Past Surgical History  Procedure Laterality Date  . Abdominal hysterectomy    . 3 bladder surgeries    . Cholecystectomy    . Cataract extraction, bilateral    . Joint replacement      bilateral knees  . Left ankle surgery    . Tonsillectomy      in for   Chief Complaint  Patient presents with  . Weakness     HPI  [name]  is a 80 y.o. female, non-Hodgkin's lymphoma recently diagnosed undergoing CHOP treatment by Dr. Marin Olp, DVT PE on xaralto, chronic diarrhea on Imodium for several years, hemochromatosis, whose been living at home and has been developing gradual generalized weakness for the last few weeks comes in to the ER for worsening generalized weakness and unable to stand up, in the ER workup suggests neutropenia, anemia, she also appears to be dehydrated, unable to stand up by herself and bear weight, her case was initially discussed by her PCP and then by Dr. Julien Nordmann oncologist both recommended hospitalist admission.  She denies fever chills, no headache or chest pain, no abdominal pain, no new joint pains or aches. No body aches.    Review of Systems    In addition to the HPI above,   No Fever-chills, No Headache, No changes with Vision or hearing, No problems swallowing food or Liquids, No Chest pain, Cough or Shortness of Breath, No Abdominal pain, No Nausea or  Vommitting, chronic diarrhea for several years No Blood in stool or Urine, No dysuria, No new skin rashes or bruises, No new joints pains-aches,  No new weakness, tingling, numbness in any extremity, generalized weakness all over No recent weight gain or loss, No polyuria, polydypsia or polyphagia, No significant Mental Stressors.  A full 10 point Review of Systems was done, except as stated above, all other Review of Systems were negative.   Social History History  Substance Use Topics  . Smoking status: Never Smoker   . Smokeless tobacco: Never Used  . Alcohol Use: No      Family History No lymphoma  Prior to Admission medications   Medication Sig Start Date End Date Taking? Authorizing Provider  aspirin 81 MG tablet Take 81 mg by mouth daily.   Yes Historical Provider, MD  atorvastatin (LIPITOR) 20 MG tablet Take 20 mg by mouth daily.   Yes Historical Provider, MD  bethanechol (URECHOLINE) 10 MG tablet Take 1 tablet (10 mg total) by mouth  3 (three) times daily. 11/14/13  Yes Volanda Napoleon, MD  escitalopram (LEXAPRO) 10 MG tablet Take 1 tablet (10 mg total) by mouth daily. 11/20/13  Yes Volanda Napoleon, MD  famciclovir (FAMVIR) 500 MG tablet Take 1 tablet (500 mg total) by mouth daily. 11/14/13  Yes Volanda Napoleon, MD  loperamide (IMODIUM) 2 MG capsule Take 2 mg by mouth as needed for diarrhea or loose stools.   Yes Historical Provider, MD  megestrol (MEGACE) 400 MG/10ML suspension Take 10 mLs (400 mg total) by mouth daily. 11/20/13  Yes Volanda Napoleon, MD  predniSONE (DELTASONE) 20 MG tablet Take 60 mg by mouth See admin instructions. 60 mg daily for 5 days following chemo treatment.   Yes Historical Provider, MD  prochlorperazine (COMPAZINE) 5 MG tablet Take 1 tablet (5 mg total) by mouth every 6 (six) hours as needed for nausea or vomiting. 11/14/13  Yes Volanda Napoleon, MD  propranolol ER (INDERAL LA) 60 MG 24 hr capsule Take 60 mg by mouth 2 (two) times daily.   Yes  Historical Provider, MD  Rivaroxaban (XARELTO) 20 MG TABS tablet Take 1 tablet (20 mg total) by mouth daily with supper. 11/30/13  Yes Volanda Napoleon, MD    No Known Allergies  Physical Exam  Vitals  Blood pressure 113/35, pulse 77, temperature 97.9 F (36.6 C), temperature source Oral, resp. rate 20, SpO2 95.00%.   1. General frail elderly white female lying in bed in NAD,    2. Normal affect and insight, Not Suicidal or Homicidal, Awake Alert, Oriented X 3.  3. No F.N deficits, ALL C.Nerves Intact, Strength 5/5 all 4 extremities, Sensation intact all 4 extremities, Plantars down going.  4. Ears and Eyes appear Normal, Conjunctivae clear, PERRLA. Moist Oral Mucosa.  5. Supple Neck, No JVD, No cervical lymphadenopathy appriciated, No Carotid Bruits. R. Subclavian P.cath  6. Symmetrical Chest wall movement, Good air movement bilaterally, CTAB.  7. RRR, No Gallops, Rubs or Murmurs, No Parasternal Heave.  8. Positive Bowel Sounds, Abdomen Soft, Non tender, No organomegaly appriciated,No rebound -guarding or rigidity.  9.  No Cyanosis, Normal Skin Turgor, No Skin Rash or Bruise.  10. Good muscle tone,  joints appear normal , no effusions, Normal ROM.  11. No Palpable Lymph Nodes in Neck or Axillae     Data Review  CBC  Recent Labs Lab 12/09/13 1705  WBC 1.2*  HGB 7.9*  HCT 22.1*  PLT 201  MCV 96.5  MCH 34.5*  MCHC 35.7  RDW 16.9*  LYMPHSABS 0.4*  MONOABS 0.2  EOSABS 0.0  BASOSABS 0.0   ------------------------------------------------------------------------------------------------------------------  Chemistries   Recent Labs Lab 12/09/13 1705  NA 139  K 3.5*  CL 104  CO2 17*  GLUCOSE 175*  BUN 11  CREATININE 0.63  CALCIUM 8.4  AST 7  ALT 8  ALKPHOS 90  BILITOT 0.7   ------------------------------------------------------------------------------------------------------------------ CrCl is unknown because both a height and weight (above a minimum  accepted value) are required for this calculation. ------------------------------------------------------------------------------------------------------------------ No results found for this basename: TSH, T4TOTAL, FREET3, T3FREE, THYROIDAB,  in the last 72 hours   Coagulation profile No results found for this basename: INR, PROTIME,  in the last 168 hours ------------------------------------------------------------------------------------------------------------------- No results found for this basename: DDIMER,  in the last 72 hours -------------------------------------------------------------------------------------------------------------------  Cardiac Enzymes No results found for this basename: CK, CKMB, TROPONINI, MYOGLOBIN,  in the last 168 hours ------------------------------------------------------------------------------------------------------------------ No components found with this basename: POCBNP,    ---------------------------------------------------------------------------------------------------------------  Urinalysis    Component Value Date/Time   COLORURINE YELLOW 12/09/2013 2032   APPEARANCEUR CLOUDY* 12/09/2013 2032   LABSPEC 1.012 12/09/2013 2032   PHURINE 8.0 12/09/2013 2032   GLUCOSEU NEGATIVE 12/09/2013 2032   HGBUR NEGATIVE 12/09/2013 2032   BILIRUBINUR NEGATIVE 12/09/2013 2032   KETONESUR NEGATIVE 12/09/2013 2032   PROTEINUR NEGATIVE 12/09/2013 2032   UROBILINOGEN 1.0 12/09/2013 2032   NITRITE NEGATIVE 12/09/2013 2032   LEUKOCYTESUR NEGATIVE 12/09/2013 2032    ----------------------------------------------------------------------------------------------------------------  Imaging results:   Dg Chest 2 View  12/09/2013   CLINICAL DATA:  Weakness and shortness of breath.  EXAM: CHEST  2 VIEW  COMPARISON:  Chest x-ray 02/27/2009.  FINDINGS: Elevation of the left hemidiaphragm. Lung volumes are slightly low. No consolidative airspace disease. No pleural  effusions. No suspicious appearing pulmonary nodules or masses. No evidence of pulmonary edema. Heart size is normal. The patient is rotated to the left on today's exam, resulting in distortion of the mediastinal contours and reduced diagnostic sensitivity and specificity for mediastinal pathology. Atherosclerosis in the thoracic aorta. Right internal jugular single-lumen power porta cath with tip terminating in the mid superior vena cava.  IMPRESSION: 1. No radiographic evidence of acute cardiopulmonary disease. 2. Atherosclerosis. 3. Mild elevation of the left hemidiaphragm.   Electronically Signed   By: Vinnie Langton M.D.   On: 12/09/2013 16:00    My personal review of EKG: Rhythm NSR,  no Acute ST changes    Assessment & Plan  1. Lymphoma undergoing CHOP treatment by Dr. Marin Olp. With neutropenia and anemia. Currently afebrile, drop baseline blood cultures, as per Dr. Julien Nordmann place on Levaquin, Dr. Marin Olp to see the patient in the morning. Monitor CBC with manual differential daily.    2. Generalized weakness likely due to accommodation of lymphoma and chemotherapy. Gentle IV fluids for hydration, PT evaluation. A need placement    3. Chronic diarrhea. Imodium as needed.    4. Straight of hemachromatosis no acute issues will be followed by hematology.    5. Recent DVT PE. On [place] will be consulted.     DVT Prophylaxis Xaralto  AM Labs Ordered, also please review Full Orders  Family Communication: Admission, patients condition and plan of care including tests being ordered have been discussed with the patient and daughter who indicate understanding and agree with the plan and Code Status.  Code Status DNR  Likely DC to Home  Condition GUARDED   Time spent in minutes : 35    SINGH,PRASHANT K M.D on 12/09/2013 at 11:21 PM  Between 7am to 7pm - Pager - [phone]  After 7pm go to [email or web address] - password [email or web address]  And look for the night coverage person  covering me after hours  [facility] Office  [phone]

## 2013-12-10 DIAGNOSIS — R5381 Other malaise: Secondary | ICD-10-CM

## 2013-12-10 DIAGNOSIS — C8589 Other specified types of non-Hodgkin lymphoma, extranodal and solid organ sites: Secondary | ICD-10-CM

## 2013-12-10 DIAGNOSIS — E860 Dehydration: Secondary | ICD-10-CM

## 2013-12-10 DIAGNOSIS — R5383 Other fatigue: Secondary | ICD-10-CM

## 2013-12-10 DIAGNOSIS — F0390 Unspecified dementia without behavioral disturbance: Secondary | ICD-10-CM

## 2013-12-10 LAB — ABO/RH: ABO/RH(D): O POS

## 2013-12-10 LAB — CBC WITH DIFFERENTIAL/PLATELET
BASOS PCT: 1 % (ref 0–1)
Basophils Absolute: 0 10*3/uL (ref 0.0–0.1)
EOS PCT: 1 % (ref 0–5)
Eosinophils Absolute: 0 10*3/uL (ref 0.0–0.7)
HCT: 19.6 % — ABNORMAL LOW (ref 36.0–46.0)
Hemoglobin: 6.8 g/dL — CL (ref 12.0–15.0)
Lymphocytes Relative: 25 % (ref 12–46)
Lymphs Abs: 0.4 10*3/uL — ABNORMAL LOW (ref 0.7–4.0)
MCH: 33.8 pg (ref 26.0–34.0)
MCHC: 34.7 g/dL (ref 30.0–36.0)
MCV: 97.5 fL (ref 78.0–100.0)
MONO ABS: 0.3 10*3/uL (ref 0.1–1.0)
Monocytes Relative: 15 % — ABNORMAL HIGH (ref 3–12)
Neutro Abs: 1 10*3/uL — ABNORMAL LOW (ref 1.7–7.7)
Neutrophils Relative %: 58 % (ref 43–77)
Platelets: 137 10*3/uL — ABNORMAL LOW (ref 150–400)
RBC: 2.01 MIL/uL — AB (ref 3.87–5.11)
RDW: 17.7 % — ABNORMAL HIGH (ref 11.5–15.5)
WBC: 1.7 10*3/uL — ABNORMAL LOW (ref 4.0–10.5)

## 2013-12-10 LAB — BASIC METABOLIC PANEL
BUN: 7 mg/dL (ref 6–23)
CHLORIDE: 108 meq/L (ref 96–112)
CO2: 18 meq/L — AB (ref 19–32)
Calcium: 7.8 mg/dL — ABNORMAL LOW (ref 8.4–10.5)
Creatinine, Ser: 0.59 mg/dL (ref 0.50–1.10)
GFR calc Af Amer: >90 mL/min (ref >90)
GFR, EST NON AFRICAN AMERICAN: 84 mL/min — AB (ref >90)
Glucose, Bld: 106 mg/dL — ABNORMAL HIGH (ref 70–99)
Potassium: 3 mEq/L — ABNORMAL LOW (ref 3.7–5.3)
SODIUM: 139 meq/L (ref 137–147)

## 2013-12-10 LAB — PREPARE RBC (CROSSMATCH)

## 2013-12-10 LAB — PREALBUMIN: Prealbumin: 12.9 mg/dL — ABNORMAL LOW (ref 17.0–34.0)

## 2013-12-10 MED ORDER — FAMCICLOVIR 500 MG PO TABS
500.0000 mg | ORAL_TABLET | Freq: Every day | ORAL | Status: DC
  Administered 2013-12-10: 500 mg via ORAL
  Filled 2013-12-10 (×2): qty 1

## 2013-12-10 MED ORDER — BETHANECHOL CHLORIDE 10 MG PO TABS
10.0000 mg | ORAL_TABLET | Freq: Three times a day (TID) | ORAL | Status: DC
  Administered 2013-12-10 (×3): 10 mg via ORAL
  Filled 2013-12-10 (×6): qty 1

## 2013-12-10 MED ORDER — HYDROCODONE-ACETAMINOPHEN 5-325 MG PO TABS: 1.0000 | ORAL_TABLET | ORAL | Status: DC | PRN

## 2013-12-10 MED ORDER — PROCHLORPERAZINE MALEATE 5 MG PO TABS
5.0000 mg | ORAL_TABLET | Freq: Four times a day (QID) | ORAL | Status: DC | PRN
  Filled 2013-12-10: qty 1

## 2013-12-10 MED ORDER — LOPERAMIDE HCL 2 MG PO CAPS: 2.0000 mg | ORAL_CAPSULE | ORAL | Status: DC | PRN

## 2013-12-10 MED ORDER — ONDANSETRON HCL 4 MG/2ML IJ SOLN
4.0000 mg | Freq: Four times a day (QID) | INTRAMUSCULAR | Status: DC | PRN
  Administered 2013-12-17 – 2013-12-18 (×2): 4 mg via INTRAVENOUS
  Filled 2013-12-10 (×2): qty 2

## 2013-12-10 MED ORDER — ATORVASTATIN CALCIUM 20 MG PO TABS
20.0000 mg | ORAL_TABLET | Freq: Every day | ORAL | Status: DC
  Administered 2013-12-10 – 2013-12-19 (×9): 20 mg via ORAL
  Filled 2013-12-10 (×11): qty 1

## 2013-12-10 MED ORDER — GUAIFENESIN-DM 100-10 MG/5ML PO SYRP: 5.0000 mL | ORAL_SOLUTION | ORAL | Status: DC | PRN

## 2013-12-10 MED ORDER — RIVAROXABAN 20 MG PO TABS
20.0000 mg | ORAL_TABLET | Freq: Every day | ORAL | Status: DC
  Administered 2013-12-10 – 2013-12-17 (×8): 20 mg via ORAL
  Filled 2013-12-10 (×8): qty 1

## 2013-12-10 MED ORDER — ENSURE COMPLETE PO LIQD
237.0000 mL | Freq: Three times a day (TID) | ORAL | Status: DC
  Administered 2013-12-10: 237 mL via ORAL

## 2013-12-10 MED ORDER — POLYETHYLENE GLYCOL 3350 17 G PO PACK
17.0000 g | PACK | Freq: Every day | ORAL | Status: DC | PRN
  Filled 2013-12-10: qty 1

## 2013-12-10 MED ORDER — PREDNISONE 50 MG PO TABS
60.0000 mg | ORAL_TABLET | Freq: Every day | ORAL | Status: DC
  Filled 2013-12-10 (×2): qty 1

## 2013-12-10 MED ORDER — ACETAMINOPHEN 325 MG PO TABS
650.0000 mg | ORAL_TABLET | Freq: Once | ORAL | Status: AC
  Administered 2013-12-10: 650 mg via ORAL
  Filled 2013-12-10: qty 2

## 2013-12-10 MED ORDER — ASPIRIN EC 81 MG PO TBEC
81.0000 mg | DELAYED_RELEASE_TABLET | Freq: Every day | ORAL | Status: DC
  Administered 2013-12-10 – 2013-12-19 (×10): 81 mg via ORAL
  Filled 2013-12-10 (×11): qty 1

## 2013-12-10 MED ORDER — POTASSIUM CHLORIDE CRYS ER 20 MEQ PO TBCR
40.0000 meq | EXTENDED_RELEASE_TABLET | Freq: Three times a day (TID) | ORAL | Status: AC
Start: 2013-12-10 — End: 2013-12-10
  Administered 2013-12-10 (×2): 40 meq via ORAL
  Filled 2013-12-10 (×2): qty 2

## 2013-12-10 MED ORDER — BIOTENE DRY MOUTH MT LIQD
15.0000 mL | Freq: Two times a day (BID) | OROMUCOSAL | Status: DC
  Administered 2013-12-10 – 2013-12-13 (×6): 15 mL via OROMUCOSAL

## 2013-12-10 MED ORDER — SODIUM CHLORIDE 0.9 % IV SOLN
INTRAVENOUS | Status: DC
  Administered 2013-12-10 – 2013-12-13 (×5): via INTRAVENOUS
  Administered 2013-12-13: 75 mL via INTRAVENOUS
  Administered 2013-12-14 – 2013-12-17 (×3): via INTRAVENOUS
  Administered 2013-12-18: 30 mL via INTRAVENOUS
  Administered 2013-12-19: 30 mL/h via INTRAVENOUS

## 2013-12-10 MED ORDER — MEGESTROL ACETATE 400 MG/10ML PO SUSP
400.0000 mg | Freq: Every day | ORAL | Status: DC
  Administered 2013-12-10 – 2013-12-13 (×4): 400 mg via ORAL
  Filled 2013-12-10 (×5): qty 10

## 2013-12-10 MED ORDER — ESCITALOPRAM OXALATE 10 MG PO TABS
10.0000 mg | ORAL_TABLET | Freq: Every day | ORAL | Status: DC
Start: 2013-12-10 — End: 2013-12-14
  Administered 2013-12-10 – 2013-12-13 (×4): 10 mg via ORAL
  Filled 2013-12-10 (×5): qty 1

## 2013-12-10 MED ORDER — ONDANSETRON HCL 4 MG PO TABS: 4.0000 mg | ORAL_TABLET | Freq: Four times a day (QID) | ORAL | Status: DC | PRN

## 2013-12-10 MED ORDER — PROPRANOLOL HCL ER 60 MG PO CP24
60.0000 mg | ORAL_CAPSULE | Freq: Two times a day (BID) | ORAL | Status: DC
  Administered 2013-12-10 – 2013-12-19 (×17): 60 mg via ORAL
  Filled 2013-12-10 (×20): qty 1

## 2013-12-10 NOTE — Progress Notes (Signed)
PT Cancellation Note  Patient Details Name: LEVITA MONICAL MRN: [id number] DOB: 1931/01/15   Cancelled Treatment:    Reason Eval/Treat Not Completed: Medical issues which prohibited therapy--hgb 6.8. Will hold PT at this time. Will check back as schedule permits-possibly tomorrow. Thanks.    Weston Anna, MPT Pager: [phone]

## 2013-12-10 NOTE — Consult Note (Signed)
#   [id number] is consult note.  Frederich Cha 1:5-7

## 2013-12-10 NOTE — Progress Notes (Signed)
Attempted to call report.  RN on phone giving report to another nurse.  Left phone number for callback.  Roselind Rily

## 2013-12-10 NOTE — Progress Notes (Signed)
[facility] PROGRESS NOTE  [name] XTG:[id number] DOB: November 05, 1931 DOA: 12/09/2013 PCP: Geoffery Lyons, MD  Assessment/Plan: 1.Diffuse large cell non-Hodgkin lymphoma Follow by Dr. Marin Olp. With neutropenia and anemia. Currently afebrile.  On Levaquin to cover for infection in setting of neutropenia. \ 2 units of PRBC ordered.  Blood culture pending.  Chest X ray negative.   2. Generalized weakness likely due to accommodation of lymphoma and chemotherapy. Gentle IV fluids for hydration, PT evaluation. A need placement . Blood transfusion.   3. Chronic diarrhea. Imodium as needed. If worsening diarrhea would order C diff.  4. hemachromatosis no acute issues will be followed by hematology.  5. Recent DVT PE. On [place] will be consulted.  6. Hypokalemia: 40 meq times 2 doses.  DVT Prophylaxis Xaralto   Code Status: DNR.  Family Communication: Care discussed with Daughter who was at bedside Disposition Plan: Remain inpatient.    Consultants:  Dr Marin Olp  Procedures:  none  Antibiotics:  Levaquin 1-18  HPI/Subjective: Feeling better than yesterday,.  Objective: Filed Vitals:   12/10/13 1345  BP: 102/60  Pulse: 74  Temp: 97.7 F (36.5 C)  Resp: 18    Intake/Output Summary (Last 24 hours) at 12/10/13 1354 Last data filed at 12/10/13 1327  Gross per 24 hour  Intake    620 ml  Output    400 ml  Net    220 ml   Filed Weights   12/10/13 0143  Weight: 83.6 kg (184 lb 4.9 oz)    Exam:   General:  No distress.   Cardiovascular: S 1, S 2 RRR  Respiratory: CTA  Abdomen: BS present, soft, NT  Musculoskeletal: no edema.   Data Reviewed: Basic Metabolic Panel:  Recent Labs Lab 12/09/13 1705 12/10/13 0656  NA 139 139  K 3.5* 3.0*  CL 104 108  CO2 17* 18*  GLUCOSE 175* 106*  BUN 11 7  CREATININE 0.63 0.59  CALCIUM 8.4 7.8*   Liver Function Tests:  Recent Labs Lab 12/09/13 1705  AST 7  ALT 8  ALKPHOS 90   BILITOT 0.7  PROT 5.5*  ALBUMIN 2.7*   No results found for this basename: LIPASE, AMYLASE,  in the last 168 hours No results found for this basename: AMMONIA,  in the last 168 hours CBC:  Recent Labs Lab 12/09/13 1705 12/10/13 0656  WBC 1.2* 1.7*  NEUTROABS 0.6* 1.0*  HGB 7.9* 6.8*  HCT 22.1* 19.6*  MCV 96.5 97.5  PLT 201 137*   Cardiac Enzymes: No results found for this basename: CKTOTAL, CKMB, CKMBINDEX, TROPONINI,  in the last 168 hours BNP (last 3 results) No results found for this basename: PROBNP,  in the last 8760 hours CBG:  Recent Labs Lab 12/09/13 1619  GLUCAP 177*    No results found for this or any previous visit (from the past 240 hour(s)).   Studies: Dg Chest 2 View  12/09/2013   CLINICAL DATA:  Weakness and shortness of breath.  EXAM: CHEST  2 VIEW  COMPARISON:  Chest x-ray 02/27/2009.  FINDINGS: Elevation of the left hemidiaphragm. Lung volumes are slightly low. No consolidative airspace disease. No pleural effusions. No suspicious appearing pulmonary nodules or masses. No evidence of pulmonary edema. Heart size is normal. The patient is rotated to the left on today's exam, resulting in distortion of the mediastinal contours and reduced diagnostic sensitivity and specificity for mediastinal pathology. Atherosclerosis in the thoracic aorta. Right internal jugular single-lumen power porta cath with tip terminating  in the mid superior vena cava.  IMPRESSION: 1. No radiographic evidence of acute cardiopulmonary disease. 2. Atherosclerosis. 3. Mild elevation of the left hemidiaphragm.   Electronically Signed   By: Vinnie Langton M.D.   On: 12/09/2013 16:00    Scheduled Meds: . antiseptic oral rinse  15 mL Mouth Rinse BID  . aspirin EC  81 mg Oral Daily  . atorvastatin  20 mg Oral q1800  . bethanechol  10 mg Oral TID  . escitalopram  10 mg Oral Daily  . famciclovir  500 mg Oral Daily  . levofloxacin  750 mg Oral Daily  . megestrol  400 mg Oral Daily  .  potassium chloride  40 mEq Oral TID  . propranolol ER  60 mg Oral BID  . Rivaroxaban  20 mg Oral Q supper   Continuous Infusions: . sodium chloride 75 mL/hr at 12/10/13 1024    Principal Problem:   Neutropenia Active Problems:   Hemochromatosis, hereditary   Weakness of both lower limbs   Malnutrition of moderate degree   NHL (non-Hodgkin's lymphoma)   Weakness   Dehydration   Lymphoma    Time spent: 35 minutes.     [name]  [facility] Pager [phone]. If 7PM-7AM, please contact night-coverage at [email or web address], password [facility] 12/10/2013, 1:54 PM  LOS: 1 day

## 2013-12-10 NOTE — Progress Notes (Signed)
INITIAL NUTRITION ASSESSMENT  DOCUMENTATION CODES Per approved criteria  -Non-severe (moderate) malnutrition in the context of chronic illness  Pt meets criteria for non-severe (moderate) MALNUTRITION in the context of chronic illness as evidenced by reported intake meeting <75 % of estimated needs for >1 month and moderate fat and muscle wasting.  INTERVENTION: Ensure Complete po TID, each supplement provides 350 kcal and 13 grams of protein  NUTRITION DIAGNOSIS: Inadequate oral intake related to Non Hodgkin's lymphoma as evidenced by reported wt loss and reported intake less than estimated needs.   Goal: Pt to meet >/= 90% of their estimated nutrition needs   Monitor:  Wt, po intake, labs, acceptance of supplements  Reason for Assessment: Consult- Poor PO  80 y.o. female  Admitting Dx: Neutropenia  ASSESSMENT: 80 y.o. female, non-Hodgkin's lymphoma recently diagnosed undergoing CHOP treatment by Dr. Marin Olp, DVT PE on xaralto, chronic diarrhea on Imodium for several years, hemochromatosis, whose been living at home and has been developing gradual generalized weakness for the last few weeks comes in to the ER for worsening generalized weakness and unable to stand up.  Pt reports that she has a poor appetite and has been eating very little. This has been ongoing since October 2014. She has also lost about 13 lbs since that time. She agreed to drink Ensure Complete supplements TID.  Nutrition Focused Physical Exam:  Subcutaneous Fat:  Orbital Region: moderate wasting Upper Arm Region: moderate wasting Thoracic and Lumbar Region: n/a  Muscle:  Temple Region: moderate wasting Clavicle Bone Region: moderate wasting Clavicle and Acromion Bone Region: moderate wasting Scapular Bone Region: n/a Dorsal Hand: moderate wasting Patellar Region: n/a Anterior Thigh Region: n/a Posterior Calf Region: n/a  Edema: none   Height: Ht Readings from Last 1 Encounters:  12/10/13 5\' 3"   (1.6 m)    Weight: Wt Readings from Last 1 Encounters:  12/10/13 184 lb 4.9 oz (83.6 kg)    Ideal Body Weight: 52.4 kg  % Ideal Body Weight: 153%  Wt Readings from Last 10 Encounters:  12/10/13 184 lb 4.9 oz (83.6 kg)  11/30/13 177 lb (80.287 kg)  11/20/13 177 lb (80.287 kg)  11/14/13 181 lb (82.1 kg)  11/06/13 179 lb (81.194 kg)  10/19/13 188 lb (85.276 kg)  05/29/13 185 lb (83.915 kg)  02/15/13 184 lb (83.462 kg)  08/03/12 188 lb (85.276 kg)  01/14/12 189 lb (85.73 kg)    Usual Body Weight: 190 lbs, last October  % Usual Body Weight: 93%  BMI:  Body mass index is 32.66 kg/(m^2).  Estimated Nutritional Needs: Kcal: 2000-2300 Protein: 95-110 g Fluid: 2.0-2.3 L  Skin: Stage II pressure ulcer on anus  Diet Order: General  EDUCATION NEEDS: -No education needs identified at this time   Intake/Output Summary (Last 24 hours) at 12/10/13 1356 Last data filed at 12/10/13 1327  Gross per 24 hour  Intake    620 ml  Output    400 ml  Net    220 ml    Last BM: none recorded   Labs:   Recent Labs Lab 12/09/13 1705 12/10/13 0656  NA 139 139  K 3.5* 3.0*  CL 104 108  CO2 17* 18*  BUN 11 7  CREATININE 0.63 0.59  CALCIUM 8.4 7.8*  GLUCOSE 175* 106*    CBG (last 3)   Recent Labs  12/09/13 1619  GLUCAP 177*    Scheduled Meds: . antiseptic oral rinse  15 mL Mouth Rinse BID  . aspirin EC  81  mg Oral Daily  . atorvastatin  20 mg Oral q1800  . bethanechol  10 mg Oral TID  . escitalopram  10 mg Oral Daily  . famciclovir  500 mg Oral Daily  . levofloxacin  750 mg Oral Daily  . megestrol  400 mg Oral Daily  . potassium chloride  40 mEq Oral TID  . propranolol ER  60 mg Oral BID  . Rivaroxaban  20 mg Oral Q supper    Continuous Infusions: . sodium chloride 75 mL/hr at 12/10/13 1024    Past Medical History  Diagnosis Date  . Hemochromatosis, hereditary 01/14/2012  . Occasional tremors   . PONV (postoperative nausea and vomiting)     also hard  to wake up  . Non Hodgkin's lymphoma     Past Surgical History  Procedure Laterality Date  . Abdominal hysterectomy    . 3 bladder surgeries    . Cholecystectomy    . Cataract extraction, bilateral    . Joint replacement      bilateral knees  . Left ankle surgery    . Tonsillectomy      Terrace Arabia RD, LDN

## 2013-12-10 NOTE — Consult Note (Signed)
[name], [name]          ACCOUNT NO.:  [id number]  MEDICAL RECORD NO.:  [id number]  LOCATION:  30                         FACILITY:  [facility]  PHYSICIAN:  Volanda Napoleon, M.D.  DATE OF BIRTH:  06-15-1931  DATE OF CONSULTATION:  12/10/2013 DATE OF DISCHARGE:                                CONSULTATION   REASON FOR CONSULTATION: 1. Weakness. 2. Leukopenia. 3. Diffuse large cell non-Hodgkin lymphoma. 4. Worsening dementia.  HISTORY PRESENT ILLNESS:  [name] is a very nice 80 year old white female.  She is well-known to me.  She has recently had a diagnosis of diffuse large cell non-Hodgkin lymphoma.  She also have pulmonary embolism/DVT associated with this. She has had 2 cycles of chemotherapy with R-CHOP.  She has had dose reduced chemotherapy.  She has worsening dementia from what her daughter has been noticing. [name] is not eating.  She thinks that she is eating and drinking, but she really is not.  She is at assisted living.  At assisted living, she is not doing much.  She tried to physical therapy but has not able to.  Again, she is not eating or drinking much.  She got so weak that she could not get off the toilet.  Because of this, she was brought to the hospital and admitted.  When she was admitted, her lab studies show white cell count 1.7, hemoglobin 6.8, hematocrit 19.2, platelet count 137.  Her electrolytes showed a sodium 139, potassium 3.5, BUN 11, creatinine 0.6.  She did have a chest x-ray on admission.  Chest x-ray was unremarkable. There is no obvious pneumonia.  She was afebrile.  She was somewhat hypotensive.  She was admitted by the hospitalist.  Again she just does not have a concept of how much she is eating or drinking.  She thinks that she is but really is not.  It is apparent that she is not going to be able to receive any further chemotherapy.  She just keeps ended up in the hospital.  Even with reduced dose chemotherapy,  she has a hard time.  She is not hurting.  She is having no obvious bleeding.  She is on Xarelto.  May, she is incontinent of urine.  This has been a chronic problem for her.  Again, I am not sure if she realizes this.  She is having some diarrhea, but nothing that is frequent.  PAST MEDICAL HISTORY:  Remarkable for: 1. Diffuse large cell non-Hodgkin lymphoma. 2. Hemochromatosis. 3. Hypertension. 4. Hysterectomy. 5. Cholecystectomy.  ALLERGIES:  None.  ADMISSION MEDICATIONS: 1. Aspirin 81 mg p.o. daily. 2. Lipitor 20 mg p.o. daily. 3. Urecholine 10 mg p.o. t.i.d. 4. Lexapro 10 mg p.o. daily. 5. Famvir 500 mg p.o. daily. 6. Imodium 2 mg as needed. 7. Megace elixir 10 mL p.o. daily. 8. Inderal LA 60 mg p.o. b.i.d. throughout the 20 mg p.o. daily.  SOCIAL HISTORY:  Negative for tobacco or alcohol use.  PHYSICAL EXAMINATION:  GENERAL:  This is a debilitated white female, who has alopecia.  She has temporal muscle wasting. VITAL SIGNS:  Temperature of 98.4, pulse 78, respiratory rate 18, blood pressure 91/49. HEAD AND NECK:  Shows again temporal muscle wasting.  She has no oral lesions.  There is no mucositis.  She has no scleral icterus.  There is no adenopathy in the neck. LUNGS:  Clear bilaterally. CARDIAC:  Regular rate and rhythm with a normal S1, S2.  There are no murmurs, rubs, or bruits. ABDOMEN:  Soft.  She has good bowel sounds.  There is no fluid wave. There is no palpable abdominal mass.  There is no palpable hepatosplenomegaly. AXILLARY:  Shows no bilateral axillary adenopathy. EXTREMITIES:  Show muscle atrophy in upper and lower extremities.  She has 3/5 strength in upper and lower extremities.  She has age-related osteoarthritic changes in her joints. SKIN:  No ecchymosis or petechia. NEUROLOGICAL:  Shows no focal neurological deficits.  IMPRESSION:  [name] is an 80 year old white female.  She has recent diagnosis of diffuse large cell non-Hodgkin  lymphoma.  She has had 2 cycles of chemotherapy.  Unfortunately, her performance status is declining.  I think that this is more related to her underlying dementia which appears to be getting worse, then to the actual lymphoma.  Also, I do not think that the chemotherapy is playing much of a role right now, although she is anemic.  We will go ahead and transfuse her.  She clearly needs nutrition to see her again.  We will see about the possibility of a calorie count.  She also needs to be seen by Palliative Care.  I do not anticipate any further chemotherapy for her.  Even if we do give chemotherapy, it will not be "curative."    She will need to be seen by Physical Therapy while she is here.  Her medicines prior need to be adjusted.  Dementia is a real problem.  Again, she just does not understand or realize that she is not eating much.  She thinks that she is, but she really is not.  It is possible that maybe Psychiatry needs to come in to be involved and see about her dementia what can we done for this.  She is a no code blue.  I totally agree with this.  We will follow her.  We will certainly try to help as much as we can.     Volanda Napoleon, M.D.     PRE/MEDQ  D:  12/10/2013  T:  12/10/2013  Job:  [id number]

## 2013-12-10 NOTE — Progress Notes (Signed)
Clinical Social Work Department BRIEF PSYCHOSOCIAL ASSESSMENT 12/10/2013  Patient:  [name], [name]     Account Number:  [id number]     Admit date:  12/09/2013  Clinical Social Worker:  Lacie Scotts  Date/Time:  12/10/2013 01:53 PM  Referred by:  Physician  Date Referred:  12/10/2013 Referred for  ALF Placement   Other Referral:   Interview type:   Other interview type:    PSYCHOSOCIAL DATA Living Status:  FACILITY Admitted from facility:  [facility] Level of care:   Primary support name:  Forde Radon Primary support relationship to patient:  CHILD, ADULT Degree of support available:   supportive    CURRENT CONCERNS Current Concerns  Post-Acute Placement   Other Concerns:    SOCIAL WORK ASSESSMENT / PLAN Pt is an 80 yr old female admitted from [place].  CSW met briefly with pt's spouse / granddaughter to assist with d/c planning. Unable to meet with pt due to care being provided. Message left for pt's daughter to call CSW for assistance with d/c planning. Spouse would like pt to return to [facility] at d/c. They live there together. PT eval is pending. Once PT recommendations are available CSW will contact ALF to provide clinical info.   Assessment/plan status:  Psychosocial Support/Ongoing Assessment of Needs Other assessment/ plan:   Information/referral to community resources:   none needed at this time    PATIENT'S/FAMILY'S RESPONSE TO PLAN OF CARE: " We haven't been living there too long ( ALF ) . I'd like my to return when she's feeling better. "    Werner Lean LCSW [phone]

## 2013-12-10 NOTE — Progress Notes (Signed)
PT Cancellation Note  Patient Details Name: IRIEL NASON MRN: [id number] DOB: 14-Dec-1930   Cancelled Treatment:    Reason Eval/Treat Not Completed: Medical issues which prohibited therapy--low hgb-pt just started on 1st unit per RN. Will check back on tomorrow. Thanks.    Weston Anna, MPT Pager: [phone]

## 2013-12-11 ENCOUNTER — Inpatient Hospital Stay (HOSPITAL_COMMUNITY): Payer: Medicare Other

## 2013-12-11 ENCOUNTER — Encounter (HOSPITAL_COMMUNITY): Payer: Self-pay | Admitting: Hematology & Oncology

## 2013-12-11 DIAGNOSIS — E43 Unspecified severe protein-calorie malnutrition: Secondary | ICD-10-CM

## 2013-12-11 HISTORY — DX: Unspecified severe protein-calorie malnutrition: E43

## 2013-12-11 LAB — TYPE AND SCREEN
ABO/RH(D): O POS
Antibody Screen: NEGATIVE
UNIT DIVISION: 0
Unit division: 0

## 2013-12-11 LAB — CBC WITH DIFFERENTIAL/PLATELET
BASOS ABS: 0 10*3/uL (ref 0.0–0.1)
Basophils Relative: 0 % (ref 0–1)
EOS ABS: 0 10*3/uL (ref 0.0–0.7)
Eosinophils Relative: 0 % (ref 0–5)
HCT: 28.1 % — ABNORMAL LOW (ref 36.0–46.0)
Hemoglobin: 10 g/dL — ABNORMAL LOW (ref 12.0–15.0)
LYMPHS PCT: 14 % (ref 12–46)
Lymphs Abs: 0.5 10*3/uL — ABNORMAL LOW (ref 0.7–4.0)
MCH: 32.9 pg (ref 26.0–34.0)
MCHC: 35.6 g/dL (ref 30.0–36.0)
MCV: 92.4 fL (ref 78.0–100.0)
Monocytes Absolute: 0.4 10*3/uL (ref 0.1–1.0)
Monocytes Relative: 10 % (ref 3–12)
NEUTROS PCT: 76 % (ref 43–77)
Neutro Abs: 2.8 10*3/uL (ref 1.7–7.7)
PLATELETS: 110 10*3/uL — AB (ref 150–400)
RBC: 3.04 MIL/uL — ABNORMAL LOW (ref 3.87–5.11)
RDW: 19.5 % — AB (ref 11.5–15.5)
WBC: 3.7 10*3/uL — AB (ref 4.0–10.5)

## 2013-12-11 LAB — BASIC METABOLIC PANEL
BUN: 4 mg/dL — ABNORMAL LOW (ref 6–23)
CO2: 18 mEq/L — ABNORMAL LOW (ref 19–32)
CREATININE: 0.62 mg/dL (ref 0.50–1.10)
Calcium: 8.1 mg/dL — ABNORMAL LOW (ref 8.4–10.5)
Chloride: 110 mEq/L (ref 96–112)
GFR, EST NON AFRICAN AMERICAN: 83 mL/min — AB (ref >90)
Glucose, Bld: 113 mg/dL — ABNORMAL HIGH (ref 70–99)
Potassium: 3.4 mEq/L — ABNORMAL LOW (ref 3.7–5.3)
Sodium: 140 mEq/L (ref 137–147)

## 2013-12-11 MED ORDER — FAMCICLOVIR 500 MG PO TABS
250.0000 mg | ORAL_TABLET | Freq: Every day | ORAL | Status: DC
  Administered 2013-12-11 – 2013-12-16 (×6): 250 mg via ORAL
  Filled 2013-12-11 (×7): qty 0.5

## 2013-12-11 MED ORDER — IOHEXOL 300 MG/ML  SOLN
100.0000 mL | Freq: Once | INTRAMUSCULAR | Status: AC | PRN
  Administered 2013-12-11: 100 mL via INTRAVENOUS

## 2013-12-11 MED ORDER — DIPHENHYDRAMINE HCL 25 MG PO CAPS
25.0000 mg | ORAL_CAPSULE | Freq: Four times a day (QID) | ORAL | Status: DC | PRN
  Administered 2013-12-11 – 2013-12-18 (×5): 25 mg via ORAL
  Filled 2013-12-11 (×5): qty 1

## 2013-12-11 MED ORDER — POTASSIUM CHLORIDE 10 MEQ/50ML IV SOLN
10.0000 meq | INTRAVENOUS | Status: AC
  Administered 2013-12-11 (×3): 10 meq via INTRAVENOUS
  Filled 2013-12-11 (×3): qty 50

## 2013-12-11 MED ORDER — BOOST / RESOURCE BREEZE PO LIQD
1.0000 | Freq: Three times a day (TID) | ORAL | Status: DC
  Administered 2013-12-11 – 2013-12-19 (×17): 1 via ORAL

## 2013-12-11 MED ORDER — IOHEXOL 300 MG/ML  SOLN
25.0000 mL | INTRAMUSCULAR | Status: AC
  Administered 2013-12-11 (×2): 25 mL via ORAL

## 2013-12-11 NOTE — Progress Notes (Signed)
Calorie Count Note  48 hour calorie count ordered.  Intervention:  - Continue Resource Breeze TID  - Encouraged continued excellent meal intake - Will continue to monitor and analyze calorie count  Diet: Regular  Supplements: Resource Breeze TID  1/20 Breakfast: Pt out of room  Lunch: 532 calories, 27g protein  Supplements: 250 calories, 9g protein   Total intake so far today: 782 kcal (39% of minimum estimated needs)  36g protein (38% of minimum estimated needs)  Nutrition Dx: Inadequate oral intake related to Non Hodgkin's lymphoma as evidenced by reported wt loss and reported intake less than estimated needs - ongoing   Goal: Pt to meet >/= 90% of their estimated nutrition needs - not met, pt ate 80% of lunch    Mikey College MS, [place], [place] Pager [phone] After Hours Pager

## 2013-12-11 NOTE — Progress Notes (Signed)
Thank you for consulting the Palliative Medicine Team at [facility] to meet your patient's and family's needs.   The reason that you asked [place] to see your patient is  For  Clarification of GOC and options  We have scheduled your patient for a meeting: Daughter wishes to wait for her father and other siblings to have discussion with Dr Marin Olp before meeting with PMT,   The Surrogate decision make is: Shenea Giacobbe  Other family members that need to be present: Patient's children  Your patient is able/unable to participate: Daughter reports progressing dementia, will evaluate prior to consult  Wadie Lessen NP  Palliative Medicine Team Team Phone # [phone] Pager [phone]

## 2013-12-11 NOTE — Progress Notes (Signed)
PT Cancellation Note  Patient Details Name: AIDAH FORQUER MRN: [id number] DOB: 25-Apr-1931   Cancelled Treatment:    Reason Eval/Treat Not Completed: Fatigue/lethargy limiting ability to participate. Pt had been to test and was fatigued. Will reattempt  When pt able.   Claretha Cooper 12/11/2013, 2:52 PM Tresa Endo PT [phone]

## 2013-12-11 NOTE — Progress Notes (Signed)
[name] is feeling a little better. I appreciate everybody's seeing her.  She still very weak. She did get 2 units of blood. Her hemoglobin is up to 10. Her white cell count is up to 3.7. Platelets are 110.  We will set her up with CAT scans now and see how she responded to chemotherapy for her lymphoma.  Her potassium is 3.4. This is slightly decreased. I will give her some IV potassium.  We are trying to adjust to her medications.  We need to do a calorie count on her. May be once we show her how little she is eating, then she will have a better understanding of the problem.  She does not like the Ensure.. We will try Breeze on her.   Physical therapy will be very important for her. She really needs to get some strength back before we can think about getting her to assisted-living.  Her vital signs are okay. Blood pressure 154/71. She is afebrile. Oral exam is negative. No adenopathy is noted in her neck. Lungs are clear bilaterally. Cardiac exam regular rate and rhythm. Abdomen is soft. There is no palpable hepatospleno megaly. Extremities shows no clubbing cyanosis or edema. Neurological exam shows no focal neurological deficits.  For now, she is still not ready for discharge. I suspect that we are still looking at another 4 days or so. We need to do a calorie count on her. She needs her CAT scans done. She needs continued physical therapy.  We will replace her potassium.  We will plan to recheck her labs in the morning.  I am very grateful for the outstanding care that she is getting on [place]!!  [place] E  1 Peter 4:8-10

## 2013-12-12 LAB — CBC WITH DIFFERENTIAL/PLATELET
BASOS PCT: 1 % (ref 0–1)
Basophils Absolute: 0 10*3/uL (ref 0.0–0.1)
Eosinophils Absolute: 0 10*3/uL (ref 0.0–0.7)
Eosinophils Relative: 0 % (ref 0–5)
HCT: 31.6 % — ABNORMAL LOW (ref 36.0–46.0)
Hemoglobin: 11.3 g/dL — ABNORMAL LOW (ref 12.0–15.0)
Lymphocytes Relative: 11 % — ABNORMAL LOW (ref 12–46)
Lymphs Abs: 0.6 10*3/uL — ABNORMAL LOW (ref 0.7–4.0)
MCH: 33.3 pg (ref 26.0–34.0)
MCHC: 35.8 g/dL (ref 30.0–36.0)
MCV: 93.2 fL (ref 78.0–100.0)
Monocytes Absolute: 0.6 10*3/uL (ref 0.1–1.0)
Monocytes Relative: 11 % (ref 3–12)
NEUTROS ABS: 4.2 10*3/uL (ref 1.7–7.7)
NEUTROS PCT: 77 % (ref 43–77)
PLATELETS: 121 10*3/uL — AB (ref 150–400)
RBC: 3.39 MIL/uL — ABNORMAL LOW (ref 3.87–5.11)
RDW: 20 % — AB (ref 11.5–15.5)
WBC: 5.4 10*3/uL (ref 4.0–10.5)

## 2013-12-12 LAB — COMPREHENSIVE METABOLIC PANEL
ALK PHOS: 92 U/L (ref 39–117)
ALT: 7 U/L (ref 0–35)
AST: 8 U/L (ref 0–37)
Albumin: 2.8 g/dL — ABNORMAL LOW (ref 3.5–5.2)
BILIRUBIN TOTAL: 0.7 mg/dL (ref 0.3–1.2)
BUN: 4 mg/dL — ABNORMAL LOW (ref 6–23)
CHLORIDE: 105 meq/L (ref 96–112)
CO2: 19 mEq/L (ref 19–32)
CREATININE: 0.53 mg/dL (ref 0.50–1.10)
Calcium: 8.5 mg/dL (ref 8.4–10.5)
GFR calc Af Amer: >90 mL/min (ref >90)
GFR calc non Af Amer: 87 mL/min — ABNORMAL LOW (ref >90)
Glucose, Bld: 135 mg/dL — ABNORMAL HIGH (ref 70–99)
POTASSIUM: 3.4 meq/L — AB (ref 3.7–5.3)
SODIUM: 138 meq/L (ref 137–147)
Total Protein: 5.9 g/dL — ABNORMAL LOW (ref 6.0–8.3)

## 2013-12-12 MED ORDER — MENTHOL 3 MG MT LOZG
1.0000 | LOZENGE | OROMUCOSAL | Status: DC | PRN
  Filled 2013-12-12: qty 9

## 2013-12-12 NOTE — Progress Notes (Signed)
CSW assisting with d/c planning. Contacted [facility] this afternoon and was told that pt is from [place] at [facility]. Spoke with pt's daughter to confirm d/c expectations. Daughter states pt will return to [facility] at d/c. Daughter is aware that [facility] is able to provide additional assistance at apt, which daughter agrees will be needed. RNCM has been updated and  will assist with d/c planning needs. CSW is available if plan changes and ALF / SNF is requested.  Werner Lean LCSW [phone]

## 2013-12-12 NOTE — Progress Notes (Signed)
Ms. Stanbrough is doing okay. We started her calorie count. Again, she still think she's eating a lot. So far she is going eating 40% of what she needs. She just has a hard time understanding this.  She is refusing physical therapy because she says she is tired. She really must do physical therapy. She will do would if encouraged. I think physical therapy for helping is out.  No surprise about her scans show resolution of her lymphadenopathy.  There is no pain. There is no shortness of breath. She still has some urinary incontinence. She does have a padding on her sacral region where there is some skin breakdown.  Her vital signs looked good. She's afebrile. Blood pressure 153/76.  Her lungs are clear. Cardiac exam regular rate and rhythm. Abdomen is soft. No lymphadenopathy is appreciated. Extremities shows no clubbing cyanosis or edema. Skin shows no rashes. Neurological exam shows no focal neurological deficits.  Her labs her potassium 3.4. Calcium is 8.5. Albumin is 2.8. White cell count 5.4. Hemoglobin 11.3. Platelet count 121.  Again, she still has the weakness. We are still monitoring her oral intake. She has dementia. We still have to keep her in the hospital to try to improve her status before she can go to assisted living.  [place] 91:14-16

## 2013-12-12 NOTE — Progress Notes (Signed)
Calorie Count Note  48 hour calorie count ordered, ends tonight at dinner.   Intervention:  - Continue Resource Breeze TID  - Encouraged continued excellent meal intake (ate 100% of breakfast and lunch today) - Will continue to monitor and analyze calorie count  Diet: Regular  Supplements: Resource Breeze TID  1/20 Breakfast: Pt out of room  Lunch: 532 calories, 27g protein  Dinner: 303 calories, 6g protein Supplements: 750 calories, 27g protein  1/21 Breakfast: 517 calories, 10g protein Lunch: 404 calories, 13g protein Dinner: Not yet ordered, just finished lunch Supplements: 125 calories, 5g protein  Total intake 1/20: 1585 kcal (79% of minimum estimated needs)  60g protein (63% of minimum estimated needs)  Nutrition Dx: Inadequate oral intake related to Non Hodgkin's lymphoma as evidenced by reported wt loss and reported intake less than estimated needs - improving  Goal: Pt to meet >/= 90% of their estimated nutrition needs - not met but improving    Mikey College MS, [place], [place] Pager [phone] After Hours Pager

## 2013-12-12 NOTE — Progress Notes (Signed)
Patient has complained of sore throat for most of day, also temps are creaping up 99.9 at 1400 , spoke with Dr. Marin Olp,  He requests influenza study and throat lozenge. Will cont to monitor.

## 2013-12-12 NOTE — Evaluation (Addendum)
Physical Therapy Evaluation Patient Details Name: [name] MRN: [id number] DOB: [birth year] Today's Date: 12/12/2013 Time: 1205-1225 PT Time Calculation (min): 20 min  PT Assessment / Plan / Recommendation History of Present Illness  80 yo female admitted with neutropenia, weakness, anemia. Hx of Non hodgkins lymphoma, tremors, dementia  Clinical Impression  On eval, pt required Min assist for mobility-able to perform bed mobility and stand pivot transfers. Demonstrates general weakness, decreased activity tolerance, impaired balance. LOB x 2 (in sitting and in standing). Recommend HHPT and 24/7 supervision at this time as long as facility can provide current level of care.     PT Assessment  Patient needs continued PT services    Follow Up Recommendations  Supervision/Assistance - 24 hour;Home health PT (as long as facility can provide current level of care)    Does the patient have the potential to tolerate intense rehabilitation      Barriers to Discharge        Equipment Recommendations  None recommended by PT    Recommendations for Other Services OT consult   Frequency Min 3X/week    Precautions / Restrictions Precautions Precautions: Fall Restrictions Weight Bearing Restrictions: No   Pertinent Vitals/Pain Pt denied pain      Mobility  Bed Mobility Overal bed mobility: Needs Assistance Bed Mobility: Supine to Sit;Sit to Supine Supine to sit: Min guard;HOB elevated Sit to supine: Min assist;HOB elevated General bed mobility comments: Assist for LEs. Increased time. [place] on handrails Transfers Overall transfer level: Needs assistance Transfers: Sit to/from Stand;Stand Pivot Transfers Sit to Stand: Min assist;From elevated surface Stand pivot transfers: Min assist General transfer comment: sit to stand x 4, stand pivot x 4 for practice,strengthening, safety. LOB x 2 posteriorly (once while sitting EOB, and once when rising from [facility]). Assist to  rise, stabilize, control descent.  Ambulation/Gait General Gait Details: Pt declined ambulation this session but agreeable to therapist checking back on tomorrow.     Exercises  Seated exercises LAQ, 5 reps, bilateral  APs, 5 reps, bilateral Marching, 5 reps, bilateral   PT Diagnosis: Difficulty walking;Generalized weakness;Altered mental status  PT Problem List: Decreased strength;Decreased activity tolerance;Decreased balance;Decreased mobility;Decreased knowledge of use of DME;Decreased cognition;Decreased safety awareness;Obesity PT Treatment Interventions: DME instruction;Gait training;Functional mobility training;Therapeutic activities;Therapeutic exercise;Patient/family education;Balance training     PT Goals(Current goals can be found in the care plan section) Acute Rehab PT Goals Patient Stated Goal: return to ALF PT Goal Formulation: With patient Time For Goal Achievement: 12/26/13 Potential to Achieve Goals: Good  Visit Information  Last PT Received On: 12/12/13 Assistance Needed: +2 (safety) History of Present Illness: 80 yo female admitted with neutropenia, weakness, anemia. Hx of Non hodgkins lymphoma, tremors, dementia       Prior Functioning  Home Living Family/patient expects to be discharged to:: Assisted living Living Arrangements: Spouse/significant other Available Help at Discharge: Family Type of Home: Apartment Home Access: Level entry;Elevator Home Layout: One level Home Equipment: Environmental consultant - 4 wheels;Shower seat;Bedside commode Prior Function Level of Independence: Independent with assistive device(s) Communication Communication: No difficulties    Cognition  Cognition Arousal/Alertness: Awake/alert Behavior During Therapy: WFL for tasks assessed/performed Overall Cognitive Status: History of cognitive impairments - at baseline    Extremity/Trunk Assessment Upper Extremity Assessment Upper Extremity Assessment: Generalized weakness Lower  Extremity Assessment Lower Extremity Assessment: Generalized weakness Cervical / Trunk Assessment Cervical / Trunk Assessment: Normal   Balance Balance Overall balance assessment: Needs assistance;History of Falls Sitting-balance support: Feet supported;Bilateral upper extremity supported  Sitting balance-Leahy Scale: Poor Standing balance support: Bilateral upper extremity supported;During functional activity Standing balance-Leahy Scale: Poor  End of Session PT - End of Session Equipment Utilized During Treatment: Gait belt Activity Tolerance: Patient limited by fatigue Patient left: in bed;with call bell/phone within reach;with family/visitor present  GP     Weston Anna, MPT Pager: [phone]

## 2013-12-13 LAB — CBC WITH DIFFERENTIAL/PLATELET
BASOS PCT: 1 % (ref 0–1)
Basophils Absolute: 0.1 10*3/uL (ref 0.0–0.1)
Eosinophils Absolute: 0 10*3/uL (ref 0.0–0.7)
Eosinophils Relative: 0 % (ref 0–5)
HCT: 30.1 % — ABNORMAL LOW (ref 36.0–46.0)
Hemoglobin: 10.5 g/dL — ABNORMAL LOW (ref 12.0–15.0)
LYMPHS PCT: 11 % — AB (ref 12–46)
Lymphs Abs: 0.6 10*3/uL — ABNORMAL LOW (ref 0.7–4.0)
MCH: 33 pg (ref 26.0–34.0)
MCHC: 34.9 g/dL (ref 30.0–36.0)
MCV: 94.7 fL (ref 78.0–100.0)
MONO ABS: 0.6 10*3/uL (ref 0.1–1.0)
Monocytes Relative: 12 % (ref 3–12)
NEUTROS PCT: 76 % (ref 43–77)
Neutro Abs: 4 10*3/uL (ref 1.7–7.7)
Platelets: 127 10*3/uL — ABNORMAL LOW (ref 150–400)
RBC: 3.18 MIL/uL — ABNORMAL LOW (ref 3.87–5.11)
RDW: 20.3 % — ABNORMAL HIGH (ref 11.5–15.5)
WBC: 5.3 10*3/uL (ref 4.0–10.5)

## 2013-12-13 LAB — BASIC METABOLIC PANEL
BUN: 5 mg/dL — ABNORMAL LOW (ref 6–23)
CALCIUM: 8.2 mg/dL — AB (ref 8.4–10.5)
CO2: 18 mEq/L — ABNORMAL LOW (ref 19–32)
Chloride: 107 mEq/L (ref 96–112)
Creatinine, Ser: 0.54 mg/dL (ref 0.50–1.10)
GFR calc Af Amer: >90 mL/min (ref >90)
GFR calc non Af Amer: 87 mL/min — ABNORMAL LOW (ref >90)
GLUCOSE: 117 mg/dL — AB (ref 70–99)
Potassium: 3.1 mEq/L — ABNORMAL LOW (ref 3.7–5.3)
Sodium: 140 mEq/L (ref 137–147)

## 2013-12-13 LAB — INFLUENZA PANEL BY PCR (TYPE A & B)
H1N1 flu by pcr: NOT DETECTED
INFLBPCR: NEGATIVE
Influenza A By PCR: NEGATIVE

## 2013-12-13 MED ORDER — DONEPEZIL HCL 5 MG PO TABS
5.0000 mg | ORAL_TABLET | Freq: Every day | ORAL | Status: DC
Start: 2013-12-13 — End: 2013-12-19
  Administered 2013-12-13 – 2013-12-18 (×6): 5 mg via ORAL
  Filled 2013-12-13 (×7): qty 1

## 2013-12-13 MED ORDER — PHENOL 1.4 % MT LIQD
2.0000 | Freq: Four times a day (QID) | OROMUCOSAL | Status: DC
  Administered 2013-12-13 – 2013-12-15 (×6): 2 via OROMUCOSAL
  Filled 2013-12-13: qty 177

## 2013-12-13 NOTE — Progress Notes (Signed)
Physical Therapy Treatment Patient Details Name: FALICIA LIZOTTE MRN: [id number] DOB: 1931-05-19 Today's Date: 12/13/2013 Time: 6283-6629 PT Time Calculation (min): 36 min  PT Assessment / Plan / Recommendation  History of Present Illness 80 yo female admitted with neutropenia, weakness, anemia. Hx of Non hodgkins lymphoma, tremors, dementia   PT Comments   Progressing with mobility. Deconditioned. Will likely need 24/7 care.   Follow Up Recommendations  Home health PT;Supervision/Assistance - 24 hour     Does the patient have the potential to tolerate intense rehabilitation     Barriers to Discharge        Equipment Recommendations  None recommended by PT    Recommendations for Other Services OT consult  Frequency Min 3X/week   Progress towards PT Goals Progress towards PT goals: Progressing toward goals  Plan Current plan remains appropriate    Precautions / Restrictions Precautions Precautions: Fall Restrictions Weight Bearing Restrictions: No   Pertinent Vitals/Pain Pt denied pain.    Mobility  Bed Mobility Overal bed mobility: Needs Assistance Bed Mobility: Supine to Sit Supine to sit: Min guard;HOB elevated General bed mobility comments: Increased time. Reliance on handrails Transfers Overall transfer level: Needs assistance Equipment used: Rolling walker (2 wheeled) Transfers: Sit to/from Stand Sit to Stand: Min assist Stand pivot transfers: Min assist General transfer comment: Assist to rise, stabilize, control descent. VCs safety, technique, hand placement Ambulation/Gait Ambulation/Gait assistance: Min assist Ambulation Distance (Feet): 100 Feet Assistive device: Rolling walker (2 wheeled) Gait Pattern/deviations: Step-through pattern;Decreased stride length;Trunk flexed General Gait Details: Assist to stabiize throughout ambulation. fatigues fairly easily.     Exercises General Exercises - Lower Extremity Quad Sets: AROM;Both;10  reps;Seated Long Arc Quad: AROM;Both;10 reps;Seated Hip ABduction/ADduction: AROM;Both;10 reps;Seated Hip Flexion/Marching: AROM;Both;10 reps;Seated   PT Diagnosis:    PT Problem List:   PT Treatment Interventions:     PT Goals (current goals can now be found in the care plan section)    Visit Information  Last PT Received On: 12/13/13 Assistance Needed: +1 History of Present Illness: 80 yo female admitted with neutropenia, weakness, anemia. Hx of Non hodgkins lymphoma, tremors, dementia    Subjective Data      Cognition  Cognition Arousal/Alertness: Awake/alert Behavior During Therapy: WFL for tasks assessed/performed Overall Cognitive Status: History of cognitive impairments - at baseline    Balance     End of Session PT - End of Session Equipment Utilized During Treatment: Gait belt Activity Tolerance: Patient limited by fatigue Patient left: in chair;with call bell/phone within reach   GP     Weston Anna, MPT Pager: [phone]

## 2013-12-13 NOTE — Progress Notes (Signed)
Palliative Medicine Team at [facility] has requested this consulted.  We have scheduled your patient for a meeting:  Saturday 12-15-13 @ 1 pm with Dr Billey Chang  Other family members that need to be present:   Will include husband and children of patient.  Wadie Lessen NP  Palliative Medicine Team Team Phone # [phone] Pager [phone]

## 2013-12-13 NOTE — Progress Notes (Signed)
[name] is slowly improving. No surprise that her CT scan does not show any residual lymphadenopathy. As such, I believe that she is in a clinical remission now. The 2 cycles of chemotherapy have done a good job.  Her calorie count seems to be showing better oral intake. Hopefully, now that her blood counts are improving, she will have a little bit of a better appetite.  She still is not done much physical therapy. Really have to push this. This, I believe is going to be the key for her go back to her assisted living apartment.  Her dementia is present. We may want to try some Aricept for this.  She's had a little a sore throat. We'll try some throat spray to see if this helps.  Her vital signs look good. Temperature is 99.9. Blood pressure 110/75. Heart rate is 75. Lungs are clear bilaterally. Cardiac exam regular rate and rhythm with no murmurs rubs or bruits. Oral exam does not show any erythema or mucositis in the pharynx. Abdomen is soft. She has decent bowel sounds. There is no fluid wave. There is no palpable hepatosplenomegaly.  extremities shows some slightly improved strength. Neurological exam shows no focal neurological deficits.  No labs were done today. Yesterday, her hemoglobin was up to 11.3.  Again, we have to work on her strength. We will see what her final calorie count shows. I very much appreciate nutrition and physical therapy helping [place] out.  I still think that she's probably going to be in the hospital for another 3 or 4 more days. It is more that once she does get home, that she will decline with her eating and with her physical activity.  We do not have to worry about chemotherapy in the future right now.  Loann Quill 7:7

## 2013-12-13 NOTE — Progress Notes (Signed)
Calorie Count Note  48 hour calorie count ordered, ended last night.  Pt overall eating well at mealtimes and drinking nutritional supplements.   Intervention:  - Continue Resource Breeze TID  - Encouraged excellent meal intake  - Will continue to monitor   Diet: Regular  Supplements: Resource Breeze TID  1/20 Breakfast: Pt out of room  Lunch: 532 calories, 27g protein  Dinner: 303 calories, 6g protein Supplements: 750 calories, 27g protein  1/21 Breakfast: 517 calories, 10g protein Lunch: 404 calories, 13g protein Dinner: Could not eat, barbeque burned her mouth  Supplements: 750 calories, 27g protein  Average intake of 1/20 and 1/21: 1628 kcal (81% of minimum estimated needs)  55g protein (58% of minimum estimated needs)  Nutrition Dx: Inadequate oral intake related to Non Hodgkin's lymphoma as evidenced by reported wt loss and reported intake less than estimated needs - improving  Goal: Pt to meet >/= 90% of their estimated nutrition needs - not met but improving    Mikey College MS, [place], [place] Pager [phone] After Hours Pager

## 2013-12-14 DIAGNOSIS — E876 Hypokalemia: Secondary | ICD-10-CM

## 2013-12-14 LAB — CBC WITH DIFFERENTIAL/PLATELET
BASOS ABS: 0.1 10*3/uL (ref 0.0–0.1)
Basophils Relative: 1 % (ref 0–1)
EOS ABS: 0 10*3/uL (ref 0.0–0.7)
Eosinophils Relative: 0 % (ref 0–5)
HCT: 29.4 % — ABNORMAL LOW (ref 36.0–46.0)
Hemoglobin: 10 g/dL — ABNORMAL LOW (ref 12.0–15.0)
Lymphocytes Relative: 11 % — ABNORMAL LOW (ref 12–46)
Lymphs Abs: 0.6 10*3/uL — ABNORMAL LOW (ref 0.7–4.0)
MCH: 32.3 pg (ref 26.0–34.0)
MCHC: 34 g/dL (ref 30.0–36.0)
MCV: 94.8 fL (ref 78.0–100.0)
MONO ABS: 0.7 10*3/uL (ref 0.1–1.0)
Monocytes Relative: 12 % (ref 3–12)
Neutro Abs: 4.2 10*3/uL (ref 1.7–7.7)
Neutrophils Relative %: 76 % (ref 43–77)
PLATELETS: 174 10*3/uL (ref 150–400)
RBC: 3.1 MIL/uL — ABNORMAL LOW (ref 3.87–5.11)
RDW: 21 % — AB (ref 11.5–15.5)
WBC: 5.6 10*3/uL (ref 4.0–10.5)

## 2013-12-14 LAB — COMPREHENSIVE METABOLIC PANEL
ALBUMIN: 2.1 g/dL — AB (ref 3.5–5.2)
ALT: 6 U/L (ref 0–35)
AST: 7 U/L (ref 0–37)
Alkaline Phosphatase: 71 U/L (ref 39–117)
BUN: 6 mg/dL (ref 6–23)
CALCIUM: 7.7 mg/dL — AB (ref 8.4–10.5)
CO2: 18 mEq/L — ABNORMAL LOW (ref 19–32)
Chloride: 109 mEq/L (ref 96–112)
Creatinine, Ser: 0.49 mg/dL — ABNORMAL LOW (ref 0.50–1.10)
GFR calc non Af Amer: 90 mL/min — ABNORMAL LOW (ref >90)
GLUCOSE: 133 mg/dL — AB (ref 70–99)
Potassium: 2.9 mEq/L — CL (ref 3.7–5.3)
SODIUM: 140 meq/L (ref 137–147)
TOTAL PROTEIN: 5 g/dL — AB (ref 6.0–8.3)
Total Bilirubin: 0.5 mg/dL (ref 0.3–1.2)

## 2013-12-14 LAB — PREALBUMIN: Prealbumin: 8.5 mg/dL — ABNORMAL LOW (ref 17.0–34.0)

## 2013-12-14 MED ORDER — POTASSIUM CHLORIDE 10 MEQ/50ML IV SOLN
10.0000 meq | INTRAVENOUS | Status: AC
  Administered 2013-12-14 (×4): 10 meq via INTRAVENOUS
  Filled 2013-12-14 (×4): qty 50

## 2013-12-14 MED ORDER — MEGESTROL ACETATE 400 MG/10ML PO SUSP
800.0000 mg | Freq: Every day | ORAL | Status: DC
  Administered 2013-12-14 – 2013-12-16 (×3): 800 mg via ORAL
  Filled 2013-12-14 (×3): qty 20

## 2013-12-14 MED ORDER — ESCITALOPRAM OXALATE 20 MG PO TABS
20.0000 mg | ORAL_TABLET | Freq: Every day | ORAL | Status: DC
  Administered 2013-12-14 – 2013-12-19 (×6): 20 mg via ORAL
  Filled 2013-12-14 (×7): qty 1

## 2013-12-14 MED ORDER — SODIUM CHLORIDE 0.9 % IJ SOLN
10.0000 mL | INTRAMUSCULAR | Status: DC | PRN
  Administered 2013-12-16 – 2013-12-20 (×4): 10 mL

## 2013-12-14 NOTE — Progress Notes (Signed)
CRITICAL VALUE ALERT  Critical value received:  7544  Date of notification:  12/14/2013  Time of notification: 9201  Critical value read back:yes  Nurse who received alert:  Kelby Fam, RN  MD notified (1st page):  Dr Marin Olp  Time of first page:  0615  MD notified (2nd page):  Time of second page:  Responding MD:  Dr Marin Olp  Time MD responded:  [phone] ; orders entered into the computer

## 2013-12-14 NOTE — Progress Notes (Signed)
[name] is a little weaker today. Her potassium is down to 2.9. We will give her some supplemental potassium.  I still don't think that she is eating much. Her calorie count came back. She actually did better than I would've thought.  I am not sure how well she did with physical therapy.  She is still too weak to be discharged. We have to work on trying to get her to realize that she's not eating as much as she thinks she is. I do appreciate help from the dietitian and from physical therapy.  She does have an element of dementia. This I think is an issue with respect to her eating.  Her vital signs looked okay. Blood pressure 146/84. Temperature 98.2. Oral exam is negative. Lungs are clear. Cardiac exam regular rate and rhythm. Abdomen is soft. There is no palpable hepatospleno megaly. Extremities shows symmetrical weakness in her arms and legs. Strength is 3+/5 bilaterally. Skin exam shows no ecchymoses or petechia. There is no rashes. Neurological exam shows no focal neurological deficits.  Labs show a white cell count of 5.6 hemoglobin 10 platelet count 174. Sodium 140 potassium 2.9. Glucose 90. Albumin is 2.1.  She is already on Megace. Maybe I'll try to increase the dose of this.  Again, we still have a long way to go to try to get her back to her assisted-living.   Ellene Route 22:31

## 2013-12-15 DIAGNOSIS — R32 Unspecified urinary incontinence: Secondary | ICD-10-CM

## 2013-12-15 DIAGNOSIS — R627 Adult failure to thrive: Secondary | ICD-10-CM

## 2013-12-15 DIAGNOSIS — Z515 Encounter for palliative care: Secondary | ICD-10-CM

## 2013-12-15 LAB — CBC WITH DIFFERENTIAL/PLATELET
BASOS PCT: 1 % (ref 0–1)
Basophils Absolute: 0.1 10*3/uL (ref 0.0–0.1)
Eosinophils Absolute: 0 10*3/uL (ref 0.0–0.7)
Eosinophils Relative: 0 % (ref 0–5)
HCT: 29.3 % — ABNORMAL LOW (ref 36.0–46.0)
HEMOGLOBIN: 10.3 g/dL — AB (ref 12.0–15.0)
LYMPHS PCT: 12 % (ref 12–46)
Lymphs Abs: 0.7 10*3/uL (ref 0.7–4.0)
MCH: 33.6 pg (ref 26.0–34.0)
MCHC: 35.2 g/dL (ref 30.0–36.0)
MCV: 95.4 fL (ref 78.0–100.0)
MONO ABS: 0.8 10*3/uL (ref 0.1–1.0)
Monocytes Relative: 13 % — ABNORMAL HIGH (ref 3–12)
NEUTROS PCT: 74 % (ref 43–77)
Neutro Abs: 4.6 10*3/uL (ref 1.7–7.7)
Platelets: 222 10*3/uL (ref 150–400)
RBC: 3.07 MIL/uL — AB (ref 3.87–5.11)
RDW: 20 % — ABNORMAL HIGH (ref 11.5–15.5)
WBC: 6.2 10*3/uL (ref 4.0–10.5)

## 2013-12-15 LAB — BASIC METABOLIC PANEL
BUN: 6 mg/dL (ref 6–23)
CO2: 18 mEq/L — ABNORMAL LOW (ref 19–32)
Calcium: 8.3 mg/dL — ABNORMAL LOW (ref 8.4–10.5)
Chloride: 108 mEq/L (ref 96–112)
Creatinine, Ser: 0.51 mg/dL (ref 0.50–1.10)
GFR calc Af Amer: >90 mL/min (ref >90)
GFR calc non Af Amer: 88 mL/min — ABNORMAL LOW (ref >90)
GLUCOSE: 141 mg/dL — AB (ref 70–99)
POTASSIUM: 3.2 meq/L — AB (ref 3.7–5.3)
SODIUM: 139 meq/L (ref 137–147)

## 2013-12-15 MED ORDER — POTASSIUM CHLORIDE 10 MEQ/50ML IV SOLN
10.0000 meq | INTRAVENOUS | Status: AC
Start: 2013-12-15 — End: 2013-12-15
  Administered 2013-12-15 (×5): 10 meq via INTRAVENOUS
  Filled 2013-12-15 (×5): qty 50

## 2013-12-15 NOTE — Consult Note (Addendum)
Patient MB:WGYKZLDJT [name]      DOB: June 29, 1931      TSV:[id number]     Consult Note from the Palliative Medicine Team at [place] Requested by: Dr. Marin Olp     PCP: Geoffery Lyons, MD Reason for Consultation: Goals of care    Phone [phone] Related symptom recommendations Assessment of patients Current state: Patient is an 80 year old white female with a known past medical history for hemachromatosis who was recently diagnosed with non-Hodgkin's lymphoma. She has been undergoing treatment with [facility]. Her second round was completed recently and she presented with increasing weakness fatigue and decreasing counts. Patient's daughters were present during our evaluation and reported worsening dementia, incontinence of bowel bladder, and anxiety or panic attacks. Family is concerned because she is not adjusting well to recent move into [facility] which has limited her independent lifestyle. They're concerned about the panic attacks that she is having which manifest themselves with confusion sometimes resulting in combative behavior as she cannot breathe when she gets into a fit. She also has not been eating or drinking nor self motivating to ambulate. Family is considering additional help in the home including moving to an assisted living status, in considering palliative care services in the near future. We were able to talk through some 34-year-old therapies which may help the patient work through her panic attacks. I do not believe that she has adequate insight to participate in cognitive behavioral therapy as she does have an element of adult dementia which per family is worsening. Please see my note from date of service.   Goals of Care: 1.  Code Status: DO NOT RESUSCITATE   2. Scope of Treatment: Continue supportive treatments. Family is arranging for additional help at home  4. Disposition: Back to [facility] when medically stable possible transition  from independent living to assisted living status   3. Symptom Management:   1. Anxiety/Agitation: Patient has been started on lexapro.  We discussed behavior techniques of tapping, distraction, use of fan etc.   Remeron may give benefit of improving appetite 2. Incontinence of bowel and bladder. CT scan shows laxity of pelvic muscles.  Gyno/ urological consultation may be helpful 3. Bowel Regimen: Scheduled toileting to prevent incontinence  4. Delirium: High risk continue to monitor limiting drugs that can cause delirium 5.  Dementia continue Aricept 6.  Consider palliative care services to follow in assisted living if to transition is made 4. Psychosocial: Patient has been a loving mother and spouse who maintained hospitality for her husband who was in a prominent position as a Nutritional therapist traveling the world. 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 5. Spiritual: Offered spiritual support        Patient Documents Completed or Given: Document Given Completed  Advanced Directives Pkt    MOST    DNR    Gone from My Sight    Hard Choices      Brief HPI: Patient is an 80 year old white female with a known past medical history for recent diagnosis of lymphoma, history of hemachromatosis, particularly no nutrition, incontinence of bowel or bladder, mild cognitive deficits. She presents with generalized weakness after recent treatment with [facility] for her non-Hodgkin's lymphoma   ROS: Patient guarded about review of systems. Admits to fatigue and worry but denies nausea vomiting abdominal pain. She does have anorexia and failure to thrive. "Nothing tastes good".    PMH:  Past Medical History  Diagnosis Date  . Hemochromatosis, hereditary 01/14/2012  .  Occasional tremors   . PONV (postoperative nausea and vomiting)     also hard to wake up  . Non Hodgkin's lymphoma   . Severe protein-calorie malnutrition 12/11/2013     PSH: Past Surgical History  Procedure Laterality Date  .  Abdominal hysterectomy    . 3 bladder surgeries    . Cholecystectomy    . Cataract extraction, bilateral    . Joint replacement      bilateral knees  . Left ankle surgery    . Tonsillectomy     I have reviewed the [place] and SH and  If appropriate update it with new information. No Known Allergies Scheduled Meds: . aspirin EC  81 mg Oral Daily  . atorvastatin  20 mg Oral q1800  . donepezil  5 mg Oral QHS  . escitalopram  20 mg Oral Daily  . famciclovir  250 mg Oral Daily  . feeding supplement (RESOURCE BREEZE)  1 Container Oral TID WC  . megestrol  800 mg Oral Daily  . phenol  2 spray Mouth/Throat QID  . propranolol ER  60 mg Oral BID  . Rivaroxaban  20 mg Oral Q supper   Continuous Infusions: . sodium chloride 50 mL/hr at 12/14/13 2104   PRN Meds:.diphenhydrAMINE, guaiFENesin-dextromethorphan, HYDROcodone-acetaminophen, loperamide, menthol-cetylpyridinium, ondansetron (ZOFRAN) IV, ondansetron, prochlorperazine, sodium chloride    BP 126/88  Pulse 81  Temp(Src) 99.3 F (37.4 C) (Oral)  Resp 18  Ht 5\' 3"  (1.6 m)  Wt 83.1 kg (183 lb 3.2 oz)  BMI 32.46 kg/m2  SpO2 96%   PPS: 40-50%   Intake/Output Summary (Last 24 hours) at 12/15/13 1559 Last data filed at 12/15/13 1400  Gross per 24 hour  Intake 3007.92 ml  Output      0 ml  Net 3007.92 ml   LBM: 12/14/2013  Physical Exam:  General: Awake alert somewhat anxious HEENT:  Pupils equal reactive to light extraocular muscles intact his membranes are moist, alopecia is present Chest:   Decreased but clear to auscultation no rhonchi rest or wheezes are present CVS: Regular rate and rhythm distant positive S1 and S2 no S3-S4 no murmurs or gallops Abdomen: Obese soft nontender nondistended with positive bowel sounds no hepatosplenomegaly no guarding or rebound Ext: 1+ edema Neuro: Awake alert oriented to person place general time but has some memory deficits which are obvious that she tries to cover for  Labs: CBC     Component Value Date/Time   WBC 6.2 12/15/2013 0427   WBC 45.9* 11/30/2013 0853   WBC 11.1* 02/07/2008 0923   RBC 3.07* 12/15/2013 0427   RBC 3.86* 11/06/2013 0945   RBC 4.65 02/07/2008 0923   HGB 10.3* 12/15/2013 0427   HGB 10.1* 11/30/2013 0853   HGB 15.6 02/07/2008 0923   HCT 29.3* 12/15/2013 0427   HCT 29.4* 11/30/2013 0853   HCT 44.3 02/07/2008 0923   PLT 222 12/15/2013 0427   PLT 326 11/30/2013 0853   PLT 306 02/07/2008 0923   MCV 95.4 12/15/2013 0427   MCV 101 11/30/2013 0853   MCV 95.3 02/07/2008 0923   MCH 33.6 12/15/2013 0427   MCH 34.7* 11/30/2013 0853   MCH 33.5 02/07/2008 0923   MCHC 35.2 12/15/2013 0427   MCHC 34.4 11/30/2013 0853   MCHC 35.1 02/07/2008 0923   RDW 20.0* 12/15/2013 0427   RDW 17.8* 11/30/2013 0853   RDW 12.7 02/07/2008 0923   LYMPHSABS 0.7 12/15/2013 0427   LYMPHSABS 2.8 11/30/2013 0853   LYMPHSABS 3.8* 02/07/2008  0923   MONOABS 0.8 12/15/2013 0427   MONOABS 0.9 02/07/2008 0923   EOSABS 0.0 12/15/2013 0427   EOSABS 0.0 11/30/2013 0853   EOSABS 0.2 02/07/2008 0923   BASOSABS 0.1 12/15/2013 0427   BASOSABS 0.2 11/30/2013 0853   BASOSABS 0.0 02/07/2008 0923    BMET   CMP     Component Value Date/Time   NA 139 12/15/2013 0427   NA 141 11/30/2013 0853   K 3.2* 12/15/2013 0427   K 3.2* 11/30/2013 0853   CL 108 12/15/2013 0427   CL 108 11/30/2013 0853   CO2 18* 12/15/2013 0427   CO2 24 11/30/2013 0853   GLUCOSE 141* 12/15/2013 0427   GLUCOSE 165* 11/30/2013 0853   BUN 6 12/15/2013 0427   BUN 9 11/30/2013 0853   CREATININE 0.51 12/15/2013 0427   CREATININE 0.6 11/30/2013 0853   CALCIUM 8.3* 12/15/2013 0427   CALCIUM 9.1 11/30/2013 0853   PROT 5.0* 12/14/2013 0345   PROT 6.6 11/30/2013 0853   ALBUMIN 2.1* 12/14/2013 0345   AST 7 12/14/2013 0345   AST 11 11/30/2013 0853   ALT 6 12/14/2013 0345   ALT 10 11/30/2013 0853   ALKPHOS 71 12/14/2013 0345   ALKPHOS 135* 11/30/2013 0853   BILITOT 0.5 12/14/2013 0345   BILITOT 0.70 11/30/2013 0853   GFRNONAA 88* 12/15/2013 0427   GFRAA >90 12/15/2013 0427    Chest  Xray Reviewed/Impressions  IMPRESSION:  1. No radiographic evidence of acute cardiopulmonary disease.  2. Atherosclerosis.  3. Mild elevation of the left hemidiaphragm.    CT scan of the abdomen and pelvis Reviewed/Impressions:  1. No evidence of lymphoma recurrence.  2. Resolution retroperitoneal and inguinal adenopathy.  3. Mild diverticulosis .  4. Pelvic floor laxity.      Time In Time Out Total Time Spent with Patient Total Overall Time  1 PM   2:30 PM   90 minutes   90 minutes     Greater than 50%  of this time was spent counseling and coordinating care related to the above assessment and plan.    Dutch Ing L. Lovena Le, MD MBA The Palliative Medicine Team at [facility] Phone: [phone] Pager: [phone]

## 2013-12-15 NOTE — Consult Note (Addendum)
Patient [name]      DOB: 1931-06-12      CVE:[id number]   Summary of Goals of Care; full note to follow:  Identified the following issues for [name] with the help of her daughters  1. Anxiety attacks- described as she can't breath,seemingly related to issues like is the cancer going to come back, did her husband move away. Talked with her about some CBT ways of breaking the panic attack- distraction with a book, talk with family, fan air on her face, tapping...etc.  2.  Incontinence of bowel and bladder: likely related to can't get to the restroom fast enough and then self neglect, won't tell anyone that she is wet and so had developed skin break down.  Decided on considering more frequent checks by staff with potting schedule. Asked the patient to embrace the concept of having a "ladies maid"   .  She has resisted having help stating she does not need it.   3.  Anorexia /failure to thrive: might want to discontinue megace as patient has not been consistently taking this ( tastes like "milk of magnesia". Consider convesion from liquid to pill form.  May want to consider Remeron instead of Megace.  Will check with Dr. Marin Olp.  4.  Memory deficits: patient looking to other to answer questions and has deficits for the last few months this is impairing her relationship with her daughter as their roles are beginning to reverse.  Social and emotional support offered.I do not think that CBT will help this patient as her insight into her deficits is poor.  Advised family to consider Palliative care support in assisted living to help with adjustments and support.  Total time 100 pm- 230 pm  Soma Lizak L. Lovena Le, MD MBA The Palliative Medicine Team at [facility] Phone: [phone] Pager: [phone]

## 2013-12-15 NOTE — Progress Notes (Signed)
[name] does look a little better. She is eating a little bit more. There is no nausea.  Her potassium is 3.2. We will give her some more runs of potassium.  She really needs to do physical therapy daily. She did not have this yesterday. We will try to get physical therapy to see her today.  There is no pain. She's had no bleeding. She is on is [name].  There is no problems with diarrhea.  I do still hard to say what is happening with urinary incontinence.  On her physical exam, her vital signs were temperature of 98.3. Pulse 83. Blood pressure 146/77. She is alert and oriented. Oral exam is negative. Neck is without lymph nodes. She has clear lungs. Cardiac exam regular rate and rhythm. Abdomen is soft. There is no palpable liver or spleen. Extremities shows some trace edema. She has 4/5 strength. Skin exam is unremarkable. She does have a pad on her sacral region to help prevent skin breakdown.  Her labs show a white cell count 6.2 hemoglobin 10.3 platelet count 222. Potassium 3.2. Sodium 139. Calcium 8.3.  [name] is finally beginning to improve. She seems it more alert. Hopefully, the effects of chemotherapy have worn off.  We will need to keep her through the weekend at least. I need to make sure that she continues to eat. Her prealbumin is dropping. It was 8.5 yesterday.  I I greatly appreciate the great care that has been given to her on [place]  1 Kaushik Maul 3:8

## 2013-12-16 DIAGNOSIS — R1310 Dysphagia, unspecified: Secondary | ICD-10-CM

## 2013-12-16 DIAGNOSIS — D649 Anemia, unspecified: Secondary | ICD-10-CM

## 2013-12-16 LAB — CBC WITH DIFFERENTIAL/PLATELET
BASOS PCT: 1 % (ref 0–1)
Basophils Absolute: 0.1 10*3/uL (ref 0.0–0.1)
EOS PCT: 0 % (ref 0–5)
Eosinophils Absolute: 0 10*3/uL (ref 0.0–0.7)
HCT: 30.4 % — ABNORMAL LOW (ref 36.0–46.0)
HEMOGLOBIN: 10.3 g/dL — AB (ref 12.0–15.0)
LYMPHS ABS: 0.7 10*3/uL (ref 0.7–4.0)
Lymphocytes Relative: 9 % — ABNORMAL LOW (ref 12–46)
MCH: 32.4 pg (ref 26.0–34.0)
MCHC: 33.9 g/dL (ref 30.0–36.0)
MCV: 95.6 fL (ref 78.0–100.0)
MONO ABS: 0.7 10*3/uL (ref 0.1–1.0)
Monocytes Relative: 9 % (ref 3–12)
Neutro Abs: 6.1 10*3/uL (ref 1.7–7.7)
Neutrophils Relative %: 81 % — ABNORMAL HIGH (ref 43–77)
Platelets: 318 10*3/uL (ref 150–400)
RBC: 3.18 MIL/uL — AB (ref 3.87–5.11)
RDW: 20.7 % — ABNORMAL HIGH (ref 11.5–15.5)
WBC: 7.6 10*3/uL (ref 4.0–10.5)

## 2013-12-16 LAB — BASIC METABOLIC PANEL
BUN: 7 mg/dL (ref 6–23)
CHLORIDE: 108 meq/L (ref 96–112)
CO2: 19 mEq/L (ref 19–32)
CREATININE: 0.46 mg/dL — AB (ref 0.50–1.10)
Calcium: 8.5 mg/dL (ref 8.4–10.5)
GFR calc Af Amer: >90 mL/min (ref >90)
GFR calc non Af Amer: >90 mL/min (ref >90)
Glucose, Bld: 191 mg/dL — ABNORMAL HIGH (ref 70–99)
Potassium: 3.2 mEq/L — ABNORMAL LOW (ref 3.7–5.3)
Sodium: 141 mEq/L (ref 137–147)

## 2013-12-16 LAB — CULTURE, BLOOD (ROUTINE X 2): CULTURE: NO GROWTH

## 2013-12-16 MED ORDER — MIRTAZAPINE 30 MG PO TABS
30.0000 mg | ORAL_TABLET | Freq: Every day | ORAL | Status: DC
Start: 2013-12-16 — End: 2013-12-19
  Administered 2013-12-16 – 2013-12-18 (×3): 30 mg via ORAL
  Filled 2013-12-16 (×4): qty 1

## 2013-12-16 MED ORDER — POTASSIUM CHLORIDE 10 MEQ/50ML IV SOLN
10.0000 meq | INTRAVENOUS | Status: AC
  Administered 2013-12-16 (×4): 10 meq via INTRAVENOUS
  Filled 2013-12-16 (×4): qty 50

## 2013-12-16 NOTE — Progress Notes (Signed)
[name]   DOB:February 14, 1931   XB#:[id number]   NUU#:[id number]  Subjective: I spoke to Patty her nurse.  No acute overnight events. Patient denies any pain. She sits up to chair daily.   Objective:  Filed Vitals:   12/16/13 0414  BP: 141/51  Pulse: 78  Temp: 98.8 F (37.1 C)  Resp: 18    Body mass index is 31.21 kg/(m^2).  Intake/Output Summary (Last 24 hours) at 12/16/13 1459 Last data filed at 12/16/13 0600  Gross per 24 hour  Intake    800 ml  Output      0 ml  Net    800 ml    Chronically ill appearing, alopecia.   Sclerae unicteric  Oropharynx clear  Lungs clear -- no rales or rhonchi  Heart regular rate and rhythm  Abdomen benign, soft, +BS  MSK trace peripheral edema  Neuro nonfocal  Labs:  Lab Results  Component Value Date   WBC 7.6 12/16/2013   HGB 10.3* 12/16/2013   HCT 30.4* 12/16/2013   MCV 95.6 12/16/2013   PLT 318 12/16/2013   NEUTROABS 6.1 3/47/4259   Basic Metabolic Panel:  Recent Labs Lab 12/12/13 0536 12/13/13 0350 12/14/13 0345 12/15/13 0427 12/16/13 1210  NA 138 140 140 139 141  K 3.4* 3.1* 2.9* 3.2* 3.2*  CL 105 107 109 108 108  CO2 19 18* 18* 18* 19  GLUCOSE 135* 117* 133* 141* 191*  BUN 4* 5* 6 6 7   CREATININE 0.53 0.54 0.49* 0.51 0.46*  CALCIUM 8.5 8.2* 7.7* 8.3* 8.5   GFR Estimated Creatinine Clearance: 56.1 ml/min (by C-G formula based on Cr of 0.46). Liver Function Tests:  Recent Labs Lab 12/09/13 1705 12/12/13 0536 12/14/13 0345  AST 7 8 7   ALT 8 7 6   ALKPHOS 90 92 71  BILITOT 0.7 0.7 0.5  PROT 5.5* 5.9* 5.0*  ALBUMIN 2.7* 2.8* 2.1*    CBC:  Recent Labs Lab 12/12/13 0536 12/13/13 0350 12/14/13 0345 12/15/13 0427 12/16/13 1210  WBC 5.4 5.3 5.6 6.2 7.6  NEUTROABS 4.2 4.0 4.2 4.6 6.1  HGB 11.3* 10.5* 10.0* 10.3* 10.3*  HCT 31.6* 30.1* 29.4* 29.3* 30.4*  MCV 93.2 94.7 94.8 95.4 95.6  PLT 121* 127* 174 222 318     Recent Labs Lab 12/09/13 1619  GLUCAP 177*   Microbiology Recent Results (from  the past 240 hour(s))  CULTURE, BLOOD (ROUTINE X 2)     Status: None   Collection Time    12/10/13 12:05 AM      Result Value Range Status   Specimen Description BLOOD RIGHT CHEST   Final   Special Requests BOTTLES DRAWN AEROBIC AND ANAEROBIC 5CC   Final   Culture  Setup Time     Final   Value: 12/10/2013 08:53     Performed at [facility]   Culture     Final   Value: NO GROWTH 5 DAYS     Performed at [facility]   Report Status 12/16/2013 FINAL   Final      Studies:  No results found.  Assessment/Plan:: 80 y.o. with the following.   #1. Diffuse large cell non-hodgkin lymphoma.  --s/p 2 cycles of chemotherapy with R-[facility]. She has had dose  reduced chemotherapy. --Appreciated Dr. Lovena Le of Palliative care input.   #2. Anemia likely secondary #1.. --Hbg 10.3 stable from yesterday. Normocytic.    #3. Hypokalemia. --Likely secondary decrease PO intake.  KCl 40 mEq x 1 po now.  Add in fluids or maintenance of PO daily.   #4. Decreased PO intake/FTT. --Continue Megace ES 800 mg PO daily. Per palliative care, consider remeron as as noted instead of megace. Prealbumin 8.5  #5. Dementia. --Continue Aricpet 5 mg daily.   #6. Disposition. --DNR.    Aleaha Fickling, MD 12/16/2013  2:59 PM

## 2013-12-17 DIAGNOSIS — R609 Edema, unspecified: Secondary | ICD-10-CM

## 2013-12-17 LAB — BASIC METABOLIC PANEL
BUN: 6 mg/dL (ref 6–23)
CALCIUM: 8.7 mg/dL (ref 8.4–10.5)
CO2: 20 meq/L (ref 19–32)
CREATININE: 0.48 mg/dL — AB (ref 0.50–1.10)
Chloride: 108 mEq/L (ref 96–112)
GFR calc Af Amer: >90 mL/min (ref >90)
GFR calc non Af Amer: >90 mL/min (ref >90)
GLUCOSE: 135 mg/dL — AB (ref 70–99)
Potassium: 3.8 mEq/L (ref 3.7–5.3)
Sodium: 140 mEq/L (ref 137–147)

## 2013-12-17 LAB — CBC WITH DIFFERENTIAL/PLATELET
BASOS ABS: 0.1 10*3/uL (ref 0.0–0.1)
Basophils Relative: 1 % (ref 0–1)
EOS PCT: 1 % (ref 0–5)
Eosinophils Absolute: 0 10*3/uL (ref 0.0–0.7)
HCT: 32.7 % — ABNORMAL LOW (ref 36.0–46.0)
HEMOGLOBIN: 11.1 g/dL — AB (ref 12.0–15.0)
Lymphocytes Relative: 11 % — ABNORMAL LOW (ref 12–46)
Lymphs Abs: 0.9 10*3/uL (ref 0.7–4.0)
MCH: 32.9 pg (ref 26.0–34.0)
MCHC: 33.9 g/dL (ref 30.0–36.0)
MCV: 97 fL (ref 78.0–100.0)
MONOS PCT: 13 % — AB (ref 3–12)
Monocytes Absolute: 1.1 10*3/uL — ABNORMAL HIGH (ref 0.1–1.0)
Neutro Abs: 6.4 10*3/uL (ref 1.7–7.7)
Neutrophils Relative %: 75 % (ref 43–77)
Platelets: 381 10*3/uL (ref 150–400)
RBC: 3.37 MIL/uL — ABNORMAL LOW (ref 3.87–5.11)
RDW: 20.5 % — AB (ref 11.5–15.5)
WBC: 8.5 10*3/uL (ref 4.0–10.5)

## 2013-12-17 NOTE — Progress Notes (Signed)
[name] had a good weekend. She hopefully, we'll be able to go back to the assisted living tomorrow. It looks like she might be eating a little better. She's not having pain. There is no bleeding.  I will check her legs for blood clots today. She is on Xarelto.  There is no cough. Her sore throat is better.  I am adjusting her medications a little bit more.  If she might she may have walked a little bit better yesterday. A rolling walker is definitely needed.  On her physical exam, her temperature is 90.9. Her blood pressure is 176/69. Pulse is 84. Lungs are clear. Cardiac exam regular rate and rhythm with no murmurs rubs or bruits. Abdomen is soft. She has good bowel sounds. There is no fluid wave. There is no palpable hepatospleno megaly extremities shows some trace edema in her legs. She has 4/5 strength in her legs. She has decent range of motion. Neurological exam shows no focal neurological deficits.  I appreciate palliative care seeing her in making recommendations. We certainly will have palliative care follow up as an outpatient.  Again, we will try to plan for her discharge tomorrow.  Pete E.  Hebrews 12:12

## 2013-12-17 NOTE — Progress Notes (Signed)
Physical Therapy Treatment Patient Details Name: MYRTH DAHAN MRN: [id number] DOB: 1930/12/13 Today's Date: 12/17/2013 Time:  -     PT Assessment / Plan / Recommendation  History of Present Illness 80 yo female admitted with neutropenia, weakness, anemia. Hx of Non hodgkins lymphoma, tremors, dementia   PT Comments   Pt in bed willing to "try".  Feeling "tiered". Assisted to EOB pt MAX dizzyness.  Allowed a few min to pass.  Pt required Min assist to stay upright.  Posterior LOB twice.  NT called to room to assist pt from bed to [facility].  Dizzyness cont so took vitals.  BP 77/46, HR 72 and RA 89%.  Unable to attempt amb, assisted pt off BSC to recliner.  BP increased to 115/67 with dizzyness slowly decreasing.  Reported to RN.    Follow Up Recommendations   HH at [facility] however pt performed poorly today.   Will cont to monitor mobility status and consult LPT.      Does the patient have the potential to tolerate intense rehabilitation     Barriers to Discharge        Equipment Recommendations       Recommendations for Other Services    Frequency     Progress towards PT Goals    Plan      Precautions / Restrictions Precautions Precautions: Fall Restrictions Weight Bearing Restrictions: No    Pertinent Vitals/Pain See above    Mobility  Bed Mobility Overal bed mobility: Needs Assistance Bed Mobility: Supine to Sit Supine to sit: Mod assist General bed mobility comments: increased assistance needed this session with notable posterior LOB.  Pt with limited activity tolerfance and c/o dizzyness with position change. Transfers Overall transfer level: Needs assistance Equipment used: Rolling walker (2 wheeled) Sit to Stand: +2 physical assistance;Mod assist General transfer comment: Assist to rise, stabilize, control descent. VCs safety, technique, hand placement.  Required + 2 assist for safety this session due to MAX c/o fatigue and c/o dizzyness.  BP taken  while on BSC 77/46 (reported to RN) Ambulation/Gait Gait velocity: unable to attempt amb due to hypotension assocuated with MAX c/o dizzyness      PT Goals (current goals can now be found in the care plan section)    Visit Information  Last PT Received On: 12/17/13 Assistance Needed: +1 Reason Eval/Treat Not Completed: Fatigue/lethargy limiting ability to participate History of Present Illness: 80 yo female admitted with neutropenia, weakness, anemia. Hx of Non hodgkins lymphoma, tremors, dementia    Subjective Data      Cognition       Balance     End of Session  positioned in recliner with call light in reach   Rica Koyanagi  PTA [facility]  Acute  Rehab Pager      [phone]

## 2013-12-17 NOTE — Progress Notes (Signed)
Bilateral lower extremity venous duplex completed.  Right:  DVT noted in the peroneal veins  No evidence of superficial thrombosis.  No Baker's cyst.  Left: DVT noted in the peroneal vein.  No evidence of superficial thrombosis.  No Baker's cyst.

## 2013-12-17 NOTE — Care Management Note (Addendum)
    Page 1 of 2   12/19/2013     2:10:59 PM   CARE MANAGEMENT NOTE 12/19/2013  Patient:  [name], [name]   Account Number:  [id number]  Date Initiated:  12/10/2013  Documentation initiated by:  Sunday Spillers  Subjective/Objective Assessment:   80 yo female admitted with weakness and anemia, PMH lymphoma recently treated with chemo. PTA live at [facility].     Action/Plan:   Return to ILF at [facility], daughter is bringing in extra caregivers. Daughter came to get patient at d/c and now does not think she can manage at [place], requesting hospice bed   Anticipated DC Date:  12/19/2013   Anticipated DC Plan:  [place] referral  Clinical Social Worker      [place]  CM consult      Choice offered to / List presented to:             [place].   Status of service:  Completed, signed off Medicare Important Message given?  NA - LOS <3 / Initial given by admissions (If response is "NO", the following Medicare IM given date fields will be blank) Date Medicare IM given:   Date Additional Medicare IM given:  12/18/2013  Discharge Disposition:  [place]  Per UR Regulation:  Reviewed for med. necessity/level of care/duration of stay  If discussed at [place] of Stay Meetings, dates discussed:    Comments:  12-19-13 Sunday Spillers RN CM Daughter now does not think she can manage patient at home, requesting hospice facility admission. CSW aware.  12-17-13 Sunday Spillers RN CM 76 Spoke with daughter on phone, she is ready for d/c tomorrow, requesting above equipment, has RW and BSC. Plans to add caregiver hours at [facility], requesting w/c to assist with getting patient to appointments as she has limited stamina.

## 2013-12-17 NOTE — Progress Notes (Signed)
MD was called to notify that pt's BP dropped to 77/46 when P.T. worked with her & pt c/o dizziness;  also to update MD of bilateral LE doppler results. Spoke with Dorian Pod, RN who in turn talked to MD to relay info. No new orders given by MD inspite mention of IV bolus. Per Dorian Pod, MD desires pt to continue to work with PT d/t deconditioning.  MD aware of doppler result:DVT & stated that pt is on Xarelto for that.

## 2013-12-18 ENCOUNTER — Inpatient Hospital Stay (HOSPITAL_COMMUNITY): Payer: Medicare Other

## 2013-12-18 DIAGNOSIS — R112 Nausea with vomiting, unspecified: Secondary | ICD-10-CM

## 2013-12-18 DIAGNOSIS — F411 Generalized anxiety disorder: Secondary | ICD-10-CM

## 2013-12-18 DIAGNOSIS — I959 Hypotension, unspecified: Secondary | ICD-10-CM

## 2013-12-18 DIAGNOSIS — R42 Dizziness and giddiness: Secondary | ICD-10-CM

## 2013-12-18 DIAGNOSIS — I82409 Acute embolism and thrombosis of unspecified deep veins of unspecified lower extremity: Secondary | ICD-10-CM

## 2013-12-18 LAB — BASIC METABOLIC PANEL
BUN: 12 mg/dL (ref 6–23)
CO2: 21 mEq/L (ref 19–32)
Calcium: 9 mg/dL (ref 8.4–10.5)
Chloride: 105 mEq/L (ref 96–112)
Creatinine, Ser: 0.57 mg/dL (ref 0.50–1.10)
GFR calc Af Amer: >90 mL/min (ref >90)
GFR calc non Af Amer: 85 mL/min — ABNORMAL LOW (ref >90)
Glucose, Bld: 107 mg/dL — ABNORMAL HIGH (ref 70–99)
Potassium: 3.9 mEq/L (ref 3.7–5.3)
Sodium: 139 mEq/L (ref 137–147)

## 2013-12-18 LAB — CBC WITH DIFFERENTIAL/PLATELET
Basophils Absolute: 0.1 10*3/uL (ref 0.0–0.1)
Basophils Relative: 1 % (ref 0–1)
Eosinophils Absolute: 0.1 10*3/uL (ref 0.0–0.7)
Eosinophils Relative: 1 % (ref 0–5)
HCT: 33.8 % — ABNORMAL LOW (ref 36.0–46.0)
Hemoglobin: 11.4 g/dL — ABNORMAL LOW (ref 12.0–15.0)
Lymphocytes Relative: 9 % — ABNORMAL LOW (ref 12–46)
Lymphs Abs: 1 10*3/uL (ref 0.7–4.0)
MCH: 32.7 pg (ref 26.0–34.0)
MCHC: 33.7 g/dL (ref 30.0–36.0)
MCV: 96.8 fL (ref 78.0–100.0)
Monocytes Absolute: 1.7 10*3/uL — ABNORMAL HIGH (ref 0.1–1.0)
Monocytes Relative: 16 % — ABNORMAL HIGH (ref 3–12)
Neutro Abs: 8.1 10*3/uL — ABNORMAL HIGH (ref 1.7–7.7)
Neutrophils Relative %: 74 % (ref 43–77)
Platelets: 467 10*3/uL — ABNORMAL HIGH (ref 150–400)
RBC: 3.49 MIL/uL — ABNORMAL LOW (ref 3.87–5.11)
RDW: 21 % — ABNORMAL HIGH (ref 11.5–15.5)
WBC: 10.9 10*3/uL — ABNORMAL HIGH (ref 4.0–10.5)

## 2013-12-18 MED ORDER — FENTANYL CITRATE 0.05 MG/ML IJ SOLN
INTRAMUSCULAR | Status: AC | PRN
  Administered 2013-12-18 (×2): 25 ug via INTRAVENOUS

## 2013-12-18 MED ORDER — MIRTAZAPINE 30 MG PO TABS: 30.0000 mg | ORAL_TABLET | Freq: Every day | ORAL | Status: AC

## 2013-12-18 MED ORDER — IOHEXOL 300 MG/ML  SOLN
50.0000 mL | Freq: Once | INTRAMUSCULAR | Status: AC | PRN
  Administered 2013-12-18: 50 mL via INTRAVENOUS

## 2013-12-18 MED ORDER — MIDAZOLAM HCL 2 MG/2ML IJ SOLN
INTRAMUSCULAR | Status: AC
  Filled 2013-12-18: qty 4

## 2013-12-18 MED ORDER — ESCITALOPRAM OXALATE 20 MG PO TABS: 20.0000 mg | ORAL_TABLET | Freq: Every day | ORAL | Status: AC

## 2013-12-18 MED ORDER — LIP MEDEX EX OINT
TOPICAL_OINTMENT | CUTANEOUS | Status: AC
Start: 2013-12-18 — End: 2013-12-18
  Administered 2013-12-18: 1
  Filled 2013-12-18: qty 7

## 2013-12-18 MED ORDER — DONEPEZIL HCL 5 MG PO TABS: 5.0000 mg | ORAL_TABLET | Freq: Every day | ORAL | Status: AC

## 2013-12-18 MED ORDER — FENTANYL CITRATE 0.05 MG/ML IJ SOLN
INTRAMUSCULAR | Status: AC
  Filled 2013-12-18: qty 4

## 2013-12-18 MED ORDER — LIDOCAINE HCL 1 % IJ SOLN
INTRAMUSCULAR | Status: AC
  Filled 2013-12-18: qty 20

## 2013-12-18 MED ORDER — HYDROCODONE-ACETAMINOPHEN 5-325 MG PO TABS: 1.0000 | ORAL_TABLET | ORAL | Status: AC | PRN

## 2013-12-18 NOTE — Discharge Instructions (Signed)
Inferior Vena Cava Filter Insertion Insertion of an inferior vena cava (IVC) filter is a procedure in which a filter is placed into the large vein in your abdomen that carries blood from the lower part of your body to your heart (inferior vena cava). Placement of the filter here helps prevent blood clots in the legs or pelvis from traveling to your lungs. A large blood clot in the lungs can cause death.  The filter is a small, metal device about an inch long. It is shaped like the spokes of an umbrella and is inserted through a pathway created in your neck or groin. The risks of this procedure are usually small and easily managed. Inferior vena cava filters are only used when blood thinners (anticoagulants) cannot be used to prevent blood clots from forming. This may occur because of:  You have severe platelet problems or shortage.  You have had recent or current major bleeding that cannot be treated.  You have bleeding associated with anticoagulants.  You have recurrence of blood clots while on anticoagulants.  You have a need for surgery in the near future.  You have bleeding in your head. EXPECTATIONS OF A FILTER  An IVC filter will reduce the risk of a large blood clot making its way to your lungs (pulmonary embolus, or PE). It cannot eliminate the risk completely, or prevent small PEs from occurring.  It does not stop blood clots from growing, recurring, or developing into postphlebitic syndrome. This is a condition that can occur after there is inflammation in a vein (phlebitis). LET [facility] CARE PROVIDER KNOW ABOUT:  Any allergies you have. This includes an allergy to iodine or contrast dye.  All medicines you are taking, including vitamins, herbs, eye drops, creams and over-the-counter medicines.  Previous problems you or members of your family have had with the use of anesthetics.  Any blood disorders you have.  Previous surgeries you had had.  Medical conditions you  have.  Possibility of pregnancy, if this applies. RISKS AND COMPLICATIONS Generally, this is a safe procedure. However, as with any procedure, problems can occur. Possible problems include:  A small bruise around the needle insertion site. A larger pooling of blood called a hematoma may form. This is usually of no concern.  The filter can block the vena cava. This can cause some swelling of the legs.  The filter may eventually fail and not work properly.  Continued bleeding or infections (uncommon).  Damage to the vein by the catheter (rare). BEFORE THE PROCEDURE  Your health care provider may want you to have blood tests. These tests can help tell how well your kidneys and liver are working. They can also show how well your blood clots.  If you take blood thinners, ask your health care provider if and when you should stop taking them.  Do not eat or drink for 4 hours before the procedure or as directed by your health care provider.  Make arrangements for someone to drive you home. Depending on the procedure, you may be able to go home the same day.  PROCEDURE   The procedure usually takes about 30 minutes to 1 hour. This can vary.  An IV needle will be inserted into one of your veins. Medicine will be able to flow directly into your body through this needle.  Medicines may given to help you relax and relieve anxiety (sedative).  The procedure is done through a large vein either in your neck or groin. The  skin around this area is cleaned and shaved, as necessary.  The skin and deeper tissues over the vein will be made numb with a local anesthetic. You are awake during the procedure and can let your health care providers know if you have discomfort.  A needle is then put into the vein. A guide wire is placed through the needle and into the vein. This is used to help insert a catheter and the IVC filter into your vein.  Contrast dye may be injected into the inferior vena cava to  help guide the catheter and verify precise placement of the IVC filter. X-ray equipment may also be used to verify that the catheter and the wire are in the correct position.  The wire is then withdrawn.  The IVC filter is then passed over the catheter into the vein, and inserted into the correct location in the vena cava.  The catheter is then removed. Pressure will be kept on the needle insertion point for several minutes or until it is unlikely to bleed. AFTER THE PROCEDURE  You will stay in a recovery area until any sedation medicine you were given has worn off. Your blood pressure and pulse will be checked.  If there are no problems, you should be able to go home after the procedure.  You may feel sore at the area of the needle insertion for a few days. Document Released: 12/29/2005 Document Revised: 08/29/2013 Document Reviewed: 07/16/2013 [facility] Patient Information 2014 [place].

## 2013-12-18 NOTE — Progress Notes (Signed)
Pt has refused to have porta cath reaccessed. States she is going home tomorrow and does not want to be stuck.  I explained to pt the increased risk for infection however, she is adamant about not being stuck again.  I advised her nurse, Corporate treasurer. The site looks remarkable and the tubing and cap was changed.  Catalina Pizza

## 2013-12-18 NOTE — H&P (Signed)
Agree.  Clinical indication for IVC filter.  Will proceed later today.

## 2013-12-18 NOTE — H&P (Signed)
[name] is an 80 y.o. female.   Chief Complaint: pt with hx Non Hodgkins Lymphoma Recently diagnosed with DVT/PE (11/29/13) Placed on Heparin then Xarelto po as OP To ER with agitation; confusion; incontinence; mild sob Work up included B doppler 1/26: B peroneal V thrombus New DVT while on Xarelto High fall risk- not candidate really for at home anticoagulation Scheduled now for Inferior Vena Cava filter placement  HPI: hemochromatosis; NHL; malnutrition; new DVT  Past Medical History  Diagnosis Date  . Hemochromatosis, hereditary 01/14/2012  . Occasional tremors   . PONV (postoperative nausea and vomiting)     also hard to wake up  . Non Hodgkin's lymphoma   . Severe protein-calorie malnutrition 12/11/2013    Past Surgical History  Procedure Laterality Date  . Abdominal hysterectomy    . 3 bladder surgeries    . Cholecystectomy    . Cataract extraction, bilateral    . Joint replacement      bilateral knees  . Left ankle surgery    . Tonsillectomy      No family history on file. Social History:  reports that she has never smoked. She has never used smokeless tobacco. She reports that she does not drink alcohol or use illicit drugs.  Allergies: No Known Allergies  Medications Prior to Admission  Medication Sig Dispense Refill  . aspirin 81 MG tablet Take 81 mg by mouth daily.      Marland Kitchen atorvastatin (LIPITOR) 20 MG tablet Take 20 mg by mouth daily.      . bethanechol (URECHOLINE) 10 MG tablet Take 1 tablet (10 mg total) by mouth 3 (three) times daily.  90 tablet  2  . escitalopram (LEXAPRO) 10 MG tablet Take 1 tablet (10 mg total) by mouth daily.  30 tablet  6  . famciclovir (FAMVIR) 500 MG tablet Take 1 tablet (500 mg total) by mouth daily.  30 tablet  6  . loperamide (IMODIUM) 2 MG capsule Take 2 mg by mouth as needed for diarrhea or loose stools.      . megestrol (MEGACE) 400 MG/10ML suspension Take 10 mLs (400 mg total) by mouth daily.  240 mL  2  . predniSONE  (DELTASONE) 20 MG tablet Take 60 mg by mouth See admin instructions. 60 mg daily for 5 days following chemo treatment.      . prochlorperazine (COMPAZINE) 5 MG tablet Take 1 tablet (5 mg total) by mouth every 6 (six) hours as needed for nausea or vomiting.  30 tablet  0  . propranolol ER (INDERAL LA) 60 MG 24 hr capsule Take 60 mg by mouth 2 (two) times daily.      . Rivaroxaban (XARELTO) 20 MG TABS tablet Take 1 tablet (20 mg total) by mouth daily with supper.  30 tablet  5    Results for orders placed during the hospital encounter of 12/09/13 (from the past 48 hour(s))  CBC WITH DIFFERENTIAL     Status: Abnormal   Collection Time    12/16/13 12:10 PM      Result Value Range   WBC 7.6  4.0 - 10.5 K/uL   RBC 3.18 (*) 3.87 - 5.11 MIL/uL   Hemoglobin 10.3 (*) 12.0 - 15.0 g/dL   HCT 30.4 (*) 36.0 - 46.0 %   MCV 95.6  78.0 - 100.0 fL   MCH 32.4  26.0 - 34.0 pg   MCHC 33.9  30.0 - 36.0 g/dL   RDW 20.7 (*) 11.5 - 15.5 %  Platelets 318  150 - 400 K/uL   Comment: DELTA CHECK NOTED     REPEATED TO VERIFY   Neutrophils Relative % 81 (*) 43 - 77 %   Lymphocytes Relative 9 (*) 12 - 46 %   Monocytes Relative 9  3 - 12 %   Eosinophils Relative 0  0 - 5 %   Basophils Relative 1  0 - 1 %   Neutro Abs 6.1  1.7 - 7.7 K/uL   Lymphs Abs 0.7  0.7 - 4.0 K/uL   Monocytes Absolute 0.7  0.1 - 1.0 K/uL   Eosinophils Absolute 0.0  0.0 - 0.7 K/uL   Basophils Absolute 0.1  0.0 - 0.1 K/uL   WBC Morphology MILD LEFT SHIFT (1-5% METAS, OCC MYELO, OCC BANDS)     Smear Review LARGE PLATELETS PRESENT    BASIC METABOLIC PANEL     Status: Abnormal   Collection Time    12/16/13 12:10 PM      Result Value Range   Sodium 141  137 - 147 mEq/L   Potassium 3.2 (*) 3.7 - 5.3 mEq/L   Chloride 108  96 - 112 mEq/L   CO2 19  19 - 32 mEq/L   Glucose, Bld 191 (*) 70 - 99 mg/dL   BUN 7  6 - 23 mg/dL   Creatinine, Ser 0.46 (*) 0.50 - 1.10 mg/dL   Calcium 8.5  8.4 - 10.5 mg/dL   GFR calc non Af Amer >90  >90 mL/min   GFR  calc Af Amer >90  >90 mL/min   Comment: (NOTE)     The eGFR has been calculated using the CKD EPI equation.     This calculation has not been validated in all clinical situations.     eGFR's persistently <90 mL/min signify possible Chronic Kidney     Disease.  CBC WITH DIFFERENTIAL     Status: Abnormal   Collection Time    12/17/13  4:05 AM      Result Value Range   WBC 8.5  4.0 - 10.5 K/uL   RBC 3.37 (*) 3.87 - 5.11 MIL/uL   Hemoglobin 11.1 (*) 12.0 - 15.0 g/dL   HCT 32.7 (*) 36.0 - 46.0 %   MCV 97.0  78.0 - 100.0 fL   MCH 32.9  26.0 - 34.0 pg   MCHC 33.9  30.0 - 36.0 g/dL   RDW 20.5 (*) 11.5 - 15.5 %   Platelets 381  150 - 400 K/uL   Neutrophils Relative % 75  43 - 77 %   Neutro Abs 6.4  1.7 - 7.7 K/uL   Lymphocytes Relative 11 (*) 12 - 46 %   Lymphs Abs 0.9  0.7 - 4.0 K/uL   Monocytes Relative 13 (*) 3 - 12 %   Monocytes Absolute 1.1 (*) 0.1 - 1.0 K/uL   Eosinophils Relative 1  0 - 5 %   Eosinophils Absolute 0.0  0.0 - 0.7 K/uL   Basophils Relative 1  0 - 1 %   Basophils Absolute 0.1  0.0 - 0.1 K/uL  BASIC METABOLIC PANEL     Status: Abnormal   Collection Time    12/17/13  4:05 AM      Result Value Range   Sodium 140  137 - 147 mEq/L   Potassium 3.8  3.7 - 5.3 mEq/L   Chloride 108  96 - 112 mEq/L   CO2 20  19 - 32 mEq/L   Glucose, Bld 135 (*)  70 - 99 mg/dL   BUN 6  6 - 23 mg/dL   Creatinine, Ser 0.48 (*) 0.50 - 1.10 mg/dL   Calcium 8.7  8.4 - 10.5 mg/dL   GFR calc non Af Amer >90  >90 mL/min   GFR calc Af Amer >90  >90 mL/min   Comment: (NOTE)     The eGFR has been calculated using the CKD EPI equation.     This calculation has not been validated in all clinical situations.     eGFR's persistently <90 mL/min signify possible Chronic Kidney     Disease.  CBC WITH DIFFERENTIAL     Status: Abnormal   Collection Time    12/18/13  5:45 AM      Result Value Range   WBC 10.9 (*) 4.0 - 10.5 K/uL   RBC 3.49 (*) 3.87 - 5.11 MIL/uL   Hemoglobin 11.4 (*) 12.0 - 15.0 g/dL    HCT 33.8 (*) 36.0 - 46.0 %   MCV 96.8  78.0 - 100.0 fL   MCH 32.7  26.0 - 34.0 pg   MCHC 33.7  30.0 - 36.0 g/dL   RDW 21.0 (*) 11.5 - 15.5 %   Platelets 467 (*) 150 - 400 K/uL   Neutrophils Relative % 74  43 - 77 %   Neutro Abs 8.1 (*) 1.7 - 7.7 K/uL   Lymphocytes Relative 9 (*) 12 - 46 %   Lymphs Abs 1.0  0.7 - 4.0 K/uL   Monocytes Relative 16 (*) 3 - 12 %   Monocytes Absolute 1.7 (*) 0.1 - 1.0 K/uL   Eosinophils Relative 1  0 - 5 %   Eosinophils Absolute 0.1  0.0 - 0.7 K/uL   Basophils Relative 1  0 - 1 %   Basophils Absolute 0.1  0.0 - 0.1 K/uL  BASIC METABOLIC PANEL     Status: Abnormal   Collection Time    12/18/13  5:45 AM      Result Value Range   Sodium 139  137 - 147 mEq/L   Potassium 3.9  3.7 - 5.3 mEq/L   Chloride 105  96 - 112 mEq/L   CO2 21  19 - 32 mEq/L   Glucose, Bld 107 (*) 70 - 99 mg/dL   BUN 12  6 - 23 mg/dL   Creatinine, Ser 0.57  0.50 - 1.10 mg/dL   Calcium 9.0  8.4 - 10.5 mg/dL   GFR calc non Af Amer 85 (*) >90 mL/min   GFR calc Af Amer >90  >90 mL/min   Comment: (NOTE)     The eGFR has been calculated using the CKD EPI equation.     This calculation has not been validated in all clinical situations.     eGFR's persistently <90 mL/min signify possible Chronic Kidney     Disease.   No results found.  Review of Systems  Constitutional: Positive for weight loss. Negative for fever.  Respiratory: Positive for shortness of breath.   Cardiovascular: Negative for chest pain.  Gastrointestinal: Negative for nausea and vomiting.  Neurological: Positive for dizziness and weakness.  Psychiatric/Behavioral: Negative for substance abuse. The patient is nervous/anxious.     Blood pressure 190/76, pulse 83, temperature 98.9 F (37.2 C), temperature source Oral, resp. rate 18, height _0  (1.6 m), weight 172 lb 6.4 oz (78.2 kg), SpO2 90.00%. Physical Exam  Constitutional: She is oriented to person, place, and time. She appears well-nourished.   Cardiovascular: Normal rate, regular rhythm and  normal heart sounds.   No murmur heard. Respiratory: Effort normal and breath sounds normal. She has no wheezes.  GI: Soft. Bowel sounds are normal. There is no tenderness.  Musculoskeletal: Normal range of motion.  Neurological: She is alert and oriented to person, place, and time.  Mild confusion-- Consented dtr over phone  Skin: Skin is warm and dry.  Psychiatric: She has a normal mood and affect. Her behavior is normal. Judgment and thought content normal.     Assessment/Plan Pt with known DVT/PE 1/9 and started on Heparin then Xarelto po New sxs of confusion; sob Now with new B peroneal V thrombus while on Xarelto Now high risk of falls Need IVC filter placement Pt and dtr aware of procedure benefits and risks and agreeable to proceed Consent signed and in chart  Mattisen Pohlmann A 12/18/2013, 10:04 AM

## 2013-12-18 NOTE — Progress Notes (Signed)
Patient AL:PFXTKWIOX NEL STONEKING      DOB: 1931/06/30      BDZ:[id number]  Visited with patient offered emotional support.  No family at the bedside.   Patient is generally miserable.  Very anxious wants to go home.  Upset about being woken up so early.  Patient remains not doing stellar.  She is likely hospice eligible even though her lymphoma is in remission.  She continues to fail to thrive.  Please see previous recommendations.  Malike Foglio L. Lovena Le, MD MBA The Palliative Medicine Team at [facility] Phone: [phone] Pager: [phone]

## 2013-12-18 NOTE — Procedures (Signed)
Procedure:  IVC filter placement Access:  Right IJ vein Findings:  IVC widely patent.  Bard Raymond IVC filter placed in infrarenal IVC.  No complications.

## 2013-12-18 NOTE — Progress Notes (Signed)
PT Cancellation Note  Patient Details Name: [name] MRN: [id number] DOB: [birth year]   Cancelled Treatment:    Reason Eval/Treat Not Completed: Medical issues which prohibited therapy--new diagnosis of bil LE DVT. Noted pt for IVC filter placement today. Will hold PT and check back on tomorrow. Thanks. )   Weston Anna, MPT Pager: [phone]

## 2013-12-18 NOTE — Progress Notes (Signed)
[name] had a tough day yesterday. She did do a little physical therapy. She developed some dizziness and some hypotension. I this was transient. However, this got her quite anxious. When she gets anxious, is hard for her to do much. I prayer she does not eat much yesterday. She says she has some nausea and vomiting last night.  I did do some Doppler lower legs. She still has thrombotic disease in her legs. As such, the going to need to put a filter in the IVC.  Is still hard to say how much she really is able to do. The dementia still is a huge problem for [place].  On her physical exam, her blood pressure is 150 VII nerve 5. Temperature is 98.9. Pulse is 78. Her head and neck exam shows no ocular or oral lesions. She has no thrush. There is no adenopathy in the neck. Lungs are clear bilaterally. Cardiac exam regular rate and rhythm with no murmurs rubs or bruits. Abdomen is soft. She has good bowel sounds. There is no palpable hepatospleno megaly. Extremities shows some muscle weakness which is chronic in her legs.  On her labs, her potassium 3.9. Sodium 139. Hemoglobin 11.4.  We will get the filter put in her today. We will then see about getting her home tomorrow. Am still not sure how well she will do at home. Again, the dementia is a problem.  If we can get hospice involved, I think that certainly would be helpful.  Pete E.  Hebrews 12:12

## 2013-12-19 ENCOUNTER — Other Ambulatory Visit (HOSPITAL_BASED_OUTPATIENT_CLINIC_OR_DEPARTMENT_OTHER): Payer: Medicare Other

## 2013-12-19 ENCOUNTER — Ambulatory Visit (HOSPITAL_BASED_OUTPATIENT_CLINIC_OR_DEPARTMENT_OTHER): Admission: RE | Admit: 2013-12-19 | Payer: Medicare Other | Source: Ambulatory Visit

## 2013-12-19 DIAGNOSIS — D72819 Decreased white blood cell count, unspecified: Principal | ICD-10-CM

## 2013-12-19 LAB — CBC WITH DIFFERENTIAL/PLATELET
Basophils Absolute: 0.2 10*3/uL — ABNORMAL HIGH (ref 0.0–0.1)
Basophils Relative: 2 % — ABNORMAL HIGH (ref 0–1)
Eosinophils Absolute: 0.1 10*3/uL (ref 0.0–0.7)
Eosinophils Relative: 1 % (ref 0–5)
HCT: 34.4 % — ABNORMAL LOW (ref 36.0–46.0)
HEMOGLOBIN: 11.6 g/dL — AB (ref 12.0–15.0)
Lymphocytes Relative: 10 % — ABNORMAL LOW (ref 12–46)
Lymphs Abs: 1 10*3/uL (ref 0.7–4.0)
MCH: 33 pg (ref 26.0–34.0)
MCHC: 33.7 g/dL (ref 30.0–36.0)
MCV: 98 fL (ref 78.0–100.0)
MONOS PCT: 17 % — AB (ref 3–12)
Monocytes Absolute: 1.7 10*3/uL — ABNORMAL HIGH (ref 0.1–1.0)
NEUTROS PCT: 71 % (ref 43–77)
Neutro Abs: 7.2 10*3/uL (ref 1.7–7.7)
PLATELETS: 521 10*3/uL — AB (ref 150–400)
RBC: 3.51 MIL/uL — ABNORMAL LOW (ref 3.87–5.11)
RDW: 21 % — ABNORMAL HIGH (ref 11.5–15.5)
WBC: 10.1 10*3/uL (ref 4.0–10.5)

## 2013-12-19 LAB — URINALYSIS, ROUTINE W REFLEX MICROSCOPIC
Bilirubin Urine: NEGATIVE
GLUCOSE, UA: NEGATIVE mg/dL
HGB URINE DIPSTICK: NEGATIVE
Ketones, ur: NEGATIVE mg/dL
Nitrite: POSITIVE — AB
Protein, ur: NEGATIVE mg/dL
SPECIFIC GRAVITY, URINE: 1.02 (ref 1.005–1.030)
Urobilinogen, UA: 1 mg/dL (ref 0.0–1.0)
pH: 7.5 (ref 5.0–8.0)

## 2013-12-19 LAB — BASIC METABOLIC PANEL
BUN: 13 mg/dL (ref 6–23)
CO2: 19 mEq/L (ref 19–32)
Calcium: 9 mg/dL (ref 8.4–10.5)
Chloride: 104 mEq/L (ref 96–112)
Creatinine, Ser: 0.65 mg/dL (ref 0.50–1.10)
GFR calc Af Amer: >90 mL/min (ref >90)
GFR calc non Af Amer: 82 mL/min — ABNORMAL LOW (ref >90)
GLUCOSE: 141 mg/dL — AB (ref 70–99)
POTASSIUM: 4 meq/L (ref 3.7–5.3)
SODIUM: 137 meq/L (ref 137–147)

## 2013-12-19 LAB — URINE MICROSCOPIC-ADD ON

## 2013-12-19 MED ORDER — PROPRANOLOL HCL ER 60 MG PO CP24
60.0000 mg | ORAL_CAPSULE | Freq: Every day | ORAL | Status: DC
  Filled 2013-12-19: qty 1

## 2013-12-19 MED ORDER — MIRTAZAPINE 15 MG PO TABS
15.0000 mg | ORAL_TABLET | Freq: Every day | ORAL | Status: DC
  Administered 2013-12-19: 15 mg via ORAL
  Filled 2013-12-19 (×2): qty 1

## 2013-12-19 NOTE — Progress Notes (Signed)
Palliative Medicine Team Progress Note-Family Meeting  Assessment/Problem List:  1. Non-Hodgkin's Lymphoma-recently in remission by CT after receiving chemotherapy. 2. Weight Loss and Failure to thrive-daughter Santiago Glad reports poor PO intake, no thirst drive, anosmia 3. Dehydration following chemotherapy and functional status decline 4. Marked Weakness, severe Fatigue-she walked the length of the hallway 3 days ago with PT 5. Newly diagnosed LE DVTs, had an IVC filter placed yesterday 6. She has had issues with urinary retention and recurrent UTI's in last few months   Call received to PMT phone from daughter who requests a palliative provider discuss hospice options and assist with patient and husband's coping regarding her decline.  I met with patient, her husband and her daughter Santiago Glad who is an OT at [employer]. This is a loving and devoted family who have been struggling with Kendyl's decline for the last few years. She has a had a progressive dementia that has not been fully characterized but likely vascular dementia associated with gait problems, failure to thrive and a gradual loss of functional status -markedly worse over the past year. It is unclear to me how much of that may have been related to brewing lymphoma and her other health problems vs. related to dementia progression.  Heide is highly educated with advanced degrees and her husband was a PhD Nutritional therapist who had a long career in drug development-they traveled and lived in many different countries with his work and Shaneeka was always the extroverted, social butterfly who had a natural gift for connecting with people- I can tell this very much who she is after meeting her today-even with her serious illness she has on multiple occasions shown me her beautiful and sincere smile. She and her husband have been married for 56 years and recently moved into [place] less than a month ago because her declining health -they  still have their home in [place].She was formally diagnosed with lymphoma a little over a month ago and just completed her second cycle of chemotherapy.   She was admitted with severe weakness, inability to walk and dehydration. Neutropenia and anemia on admission, now resolved. Goals until this point have been geared toward recovery-for her to go into remission and be able to regain her strength and endurance as much as possible- she has however been very slow to progress and has not made a significant improvement in her functional status-her daughter feels that her mother has really reached a point or no return-of such profound weakness and debility that she cannot recover-certain a point to which she cannot go back to ILF even with Hospice-she is too weak and unsteady and her husband cannot care for her. Santiago Glad feels a large amount of guilt about not being able to provide her care herself -but Karen's husband died of a terminal illness few years or so ago and she is a single mom to a 71 year old and has to work to provide for her family. She does not want her mother to go to SNF, even short term. She is also worried about how hard her father is taking her mother's condition-he still believes they will be able to go home together-Karen does not think this will ever be possible.  I had an open and honest conversation with them about the current situation and acknowledged the uncertainty of prognostication given that she is in technical remission and so recently post chemo- I am only seeing a snapshot of her in this acute situation-and she does appear very  ill-but I would expect this given what she has been through in the past month- I think the question of can she improve still remains unknown-but time will tell and I reassured them that we can provide comfort and QOL-excellent care and also treat reversible conditions-encourage activity and PO intake as much as she is willing and support her without it  feeling pressured or like the interventions are contributing to her suffering.  At the time of my conversation, Santiago Glad tells me that Dr. Marin Olp has secured a bed at [facility] for this patient for tomorrow and while she is very happy about this she doesn't think her father and mother really "get it". I helped the patient and husband to realize why this step was important- for the sake of excellent care, safety, and to provide time to see if her condition declares itself one way or another. While I was in the room, Danielle was having "chills"-I provided a warm blanket and offered fluids-she drank a glass of tea and seemed to enjoy the thin liquids and asked for more- she also expressed an interest in food. Miesha also endorsed feelings of denial, "cant believe this is me", never thought I would get sick like this-she is worried about her husband-he is tearful and actively grieving -really for the first time- he gracefully accepts the planned next steps for [employer]. I upheld their hope for some recovery but tempered this with making sure they both understood just how serious her current condition truly is with the post-chemo/lymphoma weakness and her dementia and decline even before this diagnosis.   Plan for [facility] with possible IV fluids, close attention to her PO intake, symptom managment as needed.  Place a Foley for her Comfort-this will also allow for [place] to check a UA-she has issues with urinary retention previously-this may be contributing to her weakness as well. She had sensitive E. Coli back in December.  Will also use some magic mouthwash for her sore throat-may have some early thrush or it may be viral pharyngitis given her current immune system state.   I spent 60 minutes in direct patient care. Greater than 50%  of this time was spent counseling and coordinating care related to the above assessment and plan.  Lane Hacker, DO Palliative Medicine

## 2013-12-19 NOTE — Progress Notes (Signed)
CSW informed by nsg that pt is unable to d/c back to Independent Living apt at [facility]. CSW spoke with pt's daughter to confirm change in plan. Daughter has spoken with Dr. Marin Olp. Daughter states MD is assisting with possible [facility] placement. CSW is available to assist with residential  hospice placement, as needed. Daughter will consider [place] hospice home if [facility] has no availability. CSW has encouraged daughter to contact CSW for ongoing assistance with d/c planning.  Werner Lean LCSW [phone]

## 2013-12-19 NOTE — Consult Note (Signed)
[facility] Liaison: Received referral from Dr. Marin Olp, confirmed with CSW. [facility] room available for [name] tomorrow. Transfer paperwork completed with patient, spouse and daughter present and participating. Patient and spouse pleased to know Dr. Marin Olp to continue to manage care at [facility]. Please fax discharge summary to [phone] and have RN call report to [phone]. Thank you. Erling Conte LCSW [phone]

## 2013-12-19 NOTE — Discharge Summary (Signed)
#   [id number] is d/c note.  Pete e.  1 Chronicles 16:11

## 2013-12-20 MED ORDER — DEXTROSE 5 % IV SOLN
1.0000 g | INTRAVENOUS | Status: DC
  Administered 2013-12-20: 1 g via INTRAVENOUS
  Filled 2013-12-20: qty 10

## 2013-12-20 MED ORDER — HEPARIN SOD (PORK) LOCK FLUSH 100 UNIT/ML IV SOLN
500.0000 [IU] | INTRAVENOUS | Status: DC
  Filled 2013-12-20: qty 5

## 2013-12-20 MED ORDER — LEVOFLOXACIN 500 MG PO TABS
500.0000 mg | ORAL_TABLET | Freq: Every day | ORAL | Status: DC
  Filled 2013-12-20: qty 1

## 2013-12-20 MED ORDER — HEPARIN SOD (PORK) LOCK FLUSH 100 UNIT/ML IV SOLN
500.0000 [IU] | INTRAVENOUS | Status: DC | PRN
  Administered 2013-12-20: 500 [IU]
  Filled 2013-12-20: qty 5

## 2013-12-20 MED ORDER — LEVOFLOXACIN 500 MG PO TABS: 500.0000 mg | ORAL_TABLET | Freq: Every day | ORAL | Status: AC

## 2013-12-20 MED ORDER — MAGIC MOUTHWASH
10.0000 mL | Freq: Three times a day (TID) | ORAL | Status: DC
  Filled 2013-12-20 (×3): qty 10

## 2013-12-20 NOTE — Progress Notes (Signed)
CSW has faxed D/C Summary to [facility] for admission today. Daughter will be here at 9 am. P-TAR pick up arranged for 9:30. Nsg will call report prior to d/c. CSW will send D/C Summary add. to [facility] when ready. [place] is aware.  Werner Lean LCSW [phone]

## 2013-12-20 NOTE — Progress Notes (Signed)
12/20/13 0847 Called report to [place] at [facility].

## 2013-12-20 NOTE — Discharge Summary (Addendum)
[name], [name]          ACCOUNT NO.:  [id number]  MEDICAL RECORD NO.:  [id number]  LOCATION:  38                         FACILITY:  [facility]  PHYSICIAN:  Volanda Napoleon, M.D.  DATE OF BIRTH:  05/07/1931  DATE OF ADMISSION:  12/09/2013 DATE OF DISCHARGE:  12/19/2013                              DISCHARGE SUMMARY   DIAGNOSES UPON DISCHARGE: 1. Dehydration. 2. Progressive dementia. 3. Protein-calorie malnutrition. 4. Non-Hodgkin lymphoma - clinical remission. 5. Placement of IVC filter. 6. Hypokalemia. 7. Chronic urinary incontinence. 8. Hereditary hemochromatosis. 9. Leukopenia secondary to chemotherapy.  CONDITION UPON DISCHARGE:  Fair, but stable.  ACTIVITIES:  The patient will need a rolling walker for ambulation.  She will have some physical therapy at her assisted living facility.  DIET:  Without restrictions.  Unfortunately, she just does not remember that she has not eaten.  FOLLOWUP: 1. The patient will be followed by hospice at [facility]. 2. I will see the patient back in the office in probably about 2     weeks.  MEDICATIONS UPON DISCHARGE: 1. Aricept 5 mg p.o. at bedtime. 2. Remeron 30 mg p.o. at bedtime. 3. Lexapro 20 mg p.o. daily. 4. Vicodin (5/325) 1-2 p.o. q.4-6 hours p.r.n. 5. Aspirin 81 mg p.o. daily. 6. Lipitor 20 mg p.o. daily. 7. Compazine 5 mg p.o. q.6 hours p.r.n. 8. Propranolol ER 60 mg p.o. b.i.d. 9. Imodium 2 mg p.o. as needed for diarrhea.  HOSPITAL COURSE:  [name] was admitted by the hospitalist.  She came in with progressive weakness.  She was not eating.  Unfortunately, her dementia is her big problem now.  She thinks that she is eating, but she really is not.  Though she came in, she was somewhat leukopenic. She was not neutropenic.  She did get Neulasta in the office after her last chemotherapy.  Last chemotherapy was about 2 weeks ago.  We did transfuse her when she was admitted.  I thought this would help her  out.  She did have staging CT scan to see how her lymphoma responded.  These were done on the 20th.  The scans did not show any evidence of residual lymphoma that would indicate non-Hodgkin lymphoma resistance.  We had Dietary involved.  We did a calorie count on her.  She basically has taken half the calories that she really needed.  Again, she keeps thinking that she is eating and she is not.  We had to give her IV fluids throughout the hospital stay.  She does have urinary incontinence.  We did check a urine on her.  This did not show any evidence of infection.  She did have a little bit of sore throat on the 3rd hospital day.  We did check influenza titers.  These were all negative.  We got Physical Therapy involved.  Again, she did not do much in the way of physical therapy.  She was trying but it was just hard for her given her state of protein-calorie malnutrition.  We did check a prealbumin on her.  This I thought would be very informative.  Her prealbumin was 8.5 on the 23rd.  This has gradually declined over the past month.  She did not  have any problems with bleeding during the hospital stay.  She is on Xarelto.  I did repeat some Dopplers of her legs.  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  As such, I just do not think that we had to continue her on Xarelto.  I was afraid that with her weakness, if she failed, she would bleed internally.  We did get an IVC filter placed into her.  This was put in on January 27th.  This was done without difficulty.  She was a no code blue upon admission.  We did discuss this with the patient's family prior to her being admitted.  Palliative Care came by to help.  They made some recommendations.  We started her on Aricept.  This is to try to help with her dementia. It is hard to say how much this will really accomplish, but I thought it was worth a try.  Again, she just thinks that she is eating and she really is not.  I am not  sure if there is anything that could be done for this.  We did start her on some Remeron at 30 mg at bedtime.  Again, this hopefully might be able to help [place] out a little bit.  We got her in position where we could discharge her back to her assisted living facility.  She wanted to go.  I felt that we really cannot do much else for her while in the hospital.  As such, we were able to discharge her on the 28th.  Upon discharge, her physical exam shows a chronically feeble white female, in no obvious distress.  Vital Signs:  Temperature 99.2, pulse 78, respiratory rate 18, blood pressure 160/55. HEAD AND NECK:  Shows no ocular or oral lesions.  She has no thrush. There is no adenopathy in the neck. LUNGS:  Clear bilaterally. CARDIAC:  Regular rate and rhythm with normal S1, S2.  There are no murmurs, rubs, or bruits. ABDOMEN:  Soft.  She has good bowel sounds.  There is no fluid wave. There is no palpable abdominal mass.  There is no palpable hepatosplenomegaly. EXTREMITIES:  Show chronic weakness in arms or legs.  Strength is 3/5 in her legs.  Strength is 4/5 in her arms.  She has some age-related osteoarthritic changes in her joints. NEUROLOGICAL:  Shows no focal neurological deficits.  Again, she has this dementia.  Laboratory studies upon discharge show white cell count 10, hemoglobin 11.6, hematocrit 34.4, platelet count 521.  Sodium 137, potassium 4, BUN 13, creatinine 0.65.  Calcium 9.0.  We will see about getting hospice to see her at [facility].  I think she is a hospice candidate.  I think that she likely will get dehydrated again.  In talking to her daughter, if this happens, then we probably would try to get her to [facility].  I spoke to [name] family in my office while she was hospitalized.  They are fully aware of what is going on and the difficulties that are ahead because of this dementia.  I told them that we do not have to worry about the lymphoma  as this appears to be in remission.  Her hemochromatosis, this is not an active problem that we have to worry about right now.     Volanda Napoleon, M.D.  ADDENDUM: Unfortunately, she was unable to get out of bed and walk. This all as part of her dementia. We've done as much as we can do  to try to improve her performance status.  She may have another UTI. Urine looks "infected."  She had E. Coli in urine in 10/2013.  This may be back.  Got IV Rocephin.  I will place on Levaquin.  I talked with her daughter, we just will not be able to get her to her assisted living. She does think that she will be able to function there.  Because of her advancing dementia, we will see about getting her to [facility]. She is hospice appropriate. She's also appropriate for [employer].  We will not discharge her today. We will plan for discharge to [facility] place on January 29.  Waldemar Dickens 27:1  PRE/MEDQ  D:  12/19/2013  T:  12/19/2013  Job:  [id number]  cc:   [place] [facility] [place]

## 2013-12-20 NOTE — Progress Notes (Signed)
UA results reviewed. Suspicious for UTI- +WBC, nitrite, turbid from a sterile sample. Will give her dose of IV rocephin tonight- await cultures- can change to oral antibiotic for move to [facility].  Lane Hacker, DO Palliative Medicine

## 2013-12-20 NOTE — Progress Notes (Signed)
Physical Therapy Discharge Patient Details Name: [name] MRN: [id number] DOB: 06-13-1931 Today's Date: 12/20/2013 Time:  -     Patient discharged from PT services secondary to medical decline - noted pt set to d/c to [facility] on today. Will sign off.   Please see latest therapy progress note for current level of functioning and progress toward goals.      GP     Weston Anna, MPT Pager: [phone]

## 2013-12-20 NOTE — Discharge Summary (Signed)
I dictated the D/C summary yesterday.  I made an addendum to this.  This should be ready for her discharge today.  She may have a UTI -- On Rocephin right now.  Will get her on oral abx empirically. Pete E.

## 2013-12-21 ENCOUNTER — Ambulatory Visit: Payer: Medicare Other | Admitting: Hematology & Oncology

## 2013-12-21 ENCOUNTER — Other Ambulatory Visit: Payer: Medicare Other | Admitting: Lab

## 2013-12-21 ENCOUNTER — Ambulatory Visit: Payer: Medicare Other

## 2013-12-21 ENCOUNTER — Telehealth: Payer: Self-pay | Admitting: Hematology & Oncology

## 2013-12-21 NOTE — Telephone Encounter (Signed)
Faxed completed FMLA papers to pt's dau Saundra Shelling) to:  [employer]  P: [phone] and [phone] F: [phone]  Mailed origs to: Saundra Shelling c/o [employer] [address] [place] [address]   COPY SCANNED

## 2013-12-22 ENCOUNTER — Ambulatory Visit: Payer: Medicare Other

## 2013-12-23 LAB — URINE CULTURE: Colony Count: >=100000

## 2014-01-03 ENCOUNTER — Encounter: Payer: Self-pay | Admitting: Hematology & Oncology

## 2014-01-11 ENCOUNTER — Ambulatory Visit: Payer: Medicare Other | Admitting: Hematology & Oncology

## 2014-01-11 ENCOUNTER — Ambulatory Visit: Payer: Medicare Other

## 2014-01-11 ENCOUNTER — Other Ambulatory Visit: Payer: Medicare Other | Admitting: Lab

## 2014-01-12 ENCOUNTER — Ambulatory Visit: Payer: Medicare Other

## 2014-01-21 ENCOUNTER — Other Ambulatory Visit: Payer: Medicare Other | Admitting: Lab

## 2014-02-01 ENCOUNTER — Ambulatory Visit: Payer: Medicare Other

## 2014-02-01 ENCOUNTER — Other Ambulatory Visit: Payer: Medicare Other | Admitting: Lab

## 2014-02-01 ENCOUNTER — Ambulatory Visit: Payer: Medicare Other | Admitting: Hematology & Oncology

## 2014-02-02 ENCOUNTER — Ambulatory Visit: Payer: Medicare Other

## 2014-02-06 ENCOUNTER — Ambulatory Visit: Payer: Self-pay | Admitting: Neurology

## 2014-02-22 ENCOUNTER — Other Ambulatory Visit: Payer: Medicare Other | Admitting: Lab

## 2014-02-22 ENCOUNTER — Ambulatory Visit: Payer: Medicare Other | Admitting: Hematology & Oncology

## 2014-02-22 ENCOUNTER — Ambulatory Visit: Payer: Medicare Other

## 2014-02-23 ENCOUNTER — Ambulatory Visit: Payer: Medicare Other

## 2014-02-25 ENCOUNTER — Encounter: Payer: Self-pay | Admitting: Nurse Practitioner

## 2014-02-25 NOTE — Progress Notes (Signed)
Received a call from Vernie Ammons, [facility] and notified us that pt had some seizure like activity this past weekend. Since then she has been rapidly declining. Family has been at the bedside and made aware of her status and are comfortable with the measures that have been taken. Per Fraser Din, RN her port has been accessed and pt has been receiving IV morphine and ativan. Dr. Marin Olp is aware of her status change.

## 2014-02-26 ENCOUNTER — Encounter: Payer: Self-pay | Admitting: Nurse Practitioner

## 2014-02-26 NOTE — Progress Notes (Signed)
Received notice from [facility] place that patient passed away 03/20/14 @855pm .

## 2014-03-22 DEATH — deceased

## 2014-04-18 ENCOUNTER — Other Ambulatory Visit: Payer: Medicare Other | Admitting: Lab

## 2014-04-18 ENCOUNTER — Ambulatory Visit: Payer: Medicare Other | Admitting: Hematology & Oncology

## 2014-10-09 ENCOUNTER — Encounter: Payer: Self-pay | Admitting: Neurology

## 2014-10-15 ENCOUNTER — Encounter: Payer: Self-pay | Admitting: Neurology

## 2014-12-30 ENCOUNTER — Other Ambulatory Visit: Payer: Self-pay | Admitting: Family

## 2015-05-22 IMAGING — US IR FLUORO GUIDE CV LINE*R*
1 series · 2 of 2 positions shown · non-contrast
Comparison: Chest and neck CT - 11/06/2013

INDICATION: History of lymphoma, in need of intravenous access for chemotherapy
administration

[Series 1: ir fluoro guide cv line*right* · 2 of 2 slices shown]
[im 1/2]
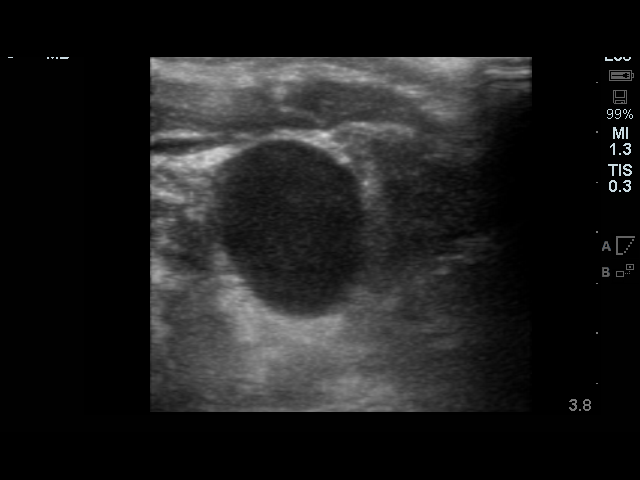
[im 2/2]
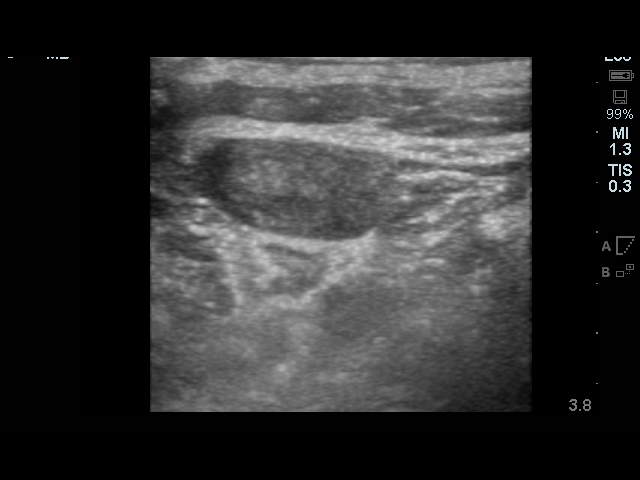

[2 of 2 positions shown; findings below may reference images not displayed]

EXAM:
IMPLANTED PORT A CATH PLACEMENT WITH ULTRASOUND AND FLUOROSCOPIC
GUIDANCE

MEDICATIONS:
Ancef 2 gm IV; IV antibiotic was given in an appropriate time
interval prior to skin puncture.

ANESTHESIA/SEDATION:
Versed 2 mg IV; Fentanyl 100 mcg IV;

Total Moderate Sedation Time

25  minutes.

CONTRAST:  None
FLUOROSCOPY TIME:  24 seconds.

PROCEDURE:
The procedure, risks, benefits, and alternatives were explained to
the patient. Questions regarding the procedure were encouraged and
answered. The patient understands and consents to the procedure.

The right neck and chest were prepped with chlorhexidine in a
sterile fashion, and a sterile drape was applied covering the
operative field. Maximum barrier sterile technique with sterile
gowns and gloves were used for the procedure. A timeout was
performed prior to the initiation of the procedure. Local anesthesia
was provided with 1% lidocaine with epinephrine.

After creating a small venotomy incision, a micropuncture kit was
utilized to access the internal jugular vein under direct, real-time
ultrasound guidance. Ultrasound image documentation was performed.
The microwire was kinked to measure appropriate catheter length.

A subcutaneous port pocket was then created along the upper chest
wall utilizing a combination of sharp and blunt dissection. The
pocket was irrigated with sterile saline. A single lumen ISP power
injectable port was chosen for placement. The 8 Fr catheter was
tunneled from the port pocket site to the venotomy incision. The
port was placed in the pocket. The external catheter was trimmed to
appropriate length. At the venotomy, an 8 Fr peel-away sheath was
placed over a guidewire under fluoroscopic guidance. The catheter
was then placed through the sheath and the sheath was removed. Final
catheter positioning was confirmed and documented with a
fluoroscopic spot radiograph. The port was accessed with Niels Umana
needle, aspirated and flushed with heparinized saline.

The venotomy site was closed with an interrupted 4-0 Vicryl suture.
The port pocket incision was closed with interrupted 2-0 Vicryl
suture and the skin was opposed with a running subcuticular 4-0
Vicryl suture. Dermabond and Foroiva were applied to both
incisions. Dressings were placed. The patient tolerated the
procedure well without immediate post procedural complication.

COMPLICATIONS:
None immediate
FINDINGS: After catheter placement, the tip lies within the superior
cavoatrial junction. The catheter aspirates and flushes normally and
is ready for immediate use.
IMPRESSION: Successful placement of a right internal jugular approach power
injectable Port-A-Cath. The catheter is ready for immediate use.

## 2015-06-24 IMAGING — CT CT NECK W/ CM
5 of 9 series · 14 of 33 positions shown, 15 images · IV contrast (OMNIPAQUE)
Comparison: CT CHEST W/CM dated 11/06/2013; CT NECK W/CM dated
11/06/2013

CLINICAL DATA: Non-Hodgkin's lymphoma status post chemotherapy.
Weakness and neutropenia with weight loss.

EXAM:
CT CHEST WITH CONTRAST; CT NECK WITH CONTRAST; CT ABDOMEN AND PELVIS
WITH CONTRAST
TECHNIQUE: Multidetector CT imaging of the neck was performed with intravenous
contrast.; Multidetector CT imaging of the abdomen and pelvis was
performed following the standard protocol during bolus
administration of intravenous contrast.; Multidetector CT imaging of
the chest was performed following the standard protocol during bolus
administration of intravenous contrast.
CONTRAST:  100mL OMNIPAQUE IOHEXOL 300 MG/ML  SOLN

[Series 2: cap st · axial · 0.82mm/px · z∈[+1032,+1652]mm · 3 of 125 slices shown, 4 images]
[im 1/125  soft-tissue]
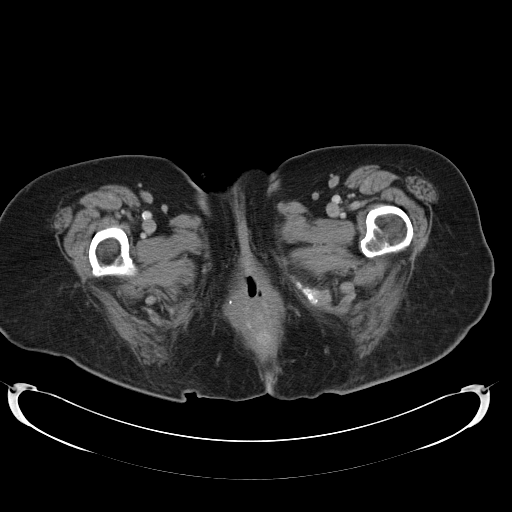
[im 1/125  bone]
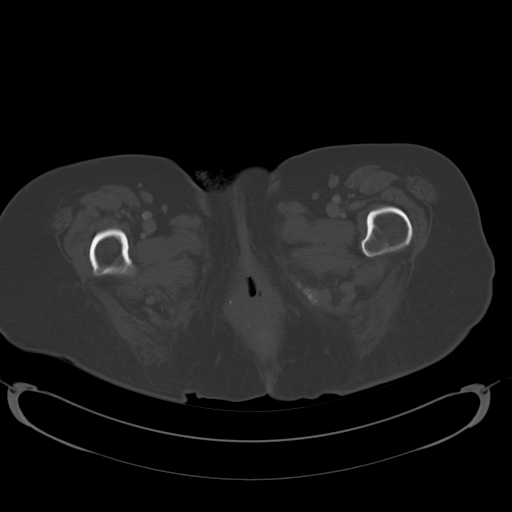
[im 63/125  bone]
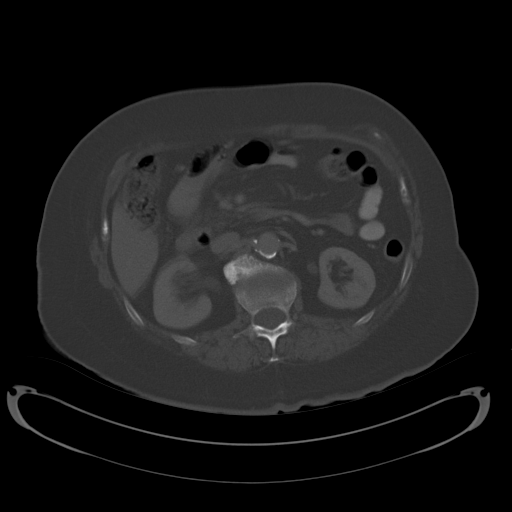
[im 125/125  bone]
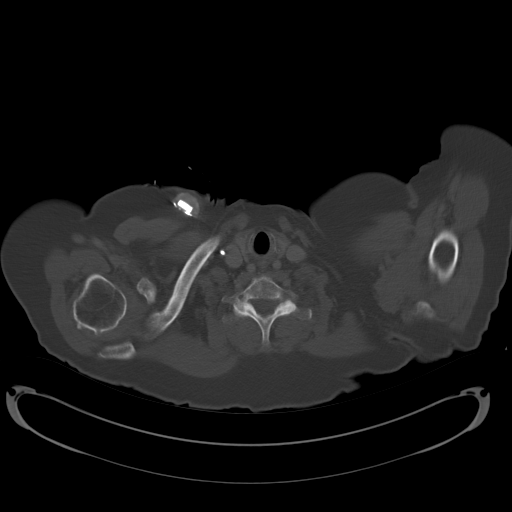

[Series 6: neck st · axial · 0.39mm/px · z∈[+1652,+1732]mm · 2 of 122 slices shown]
[im 41/122  bone]
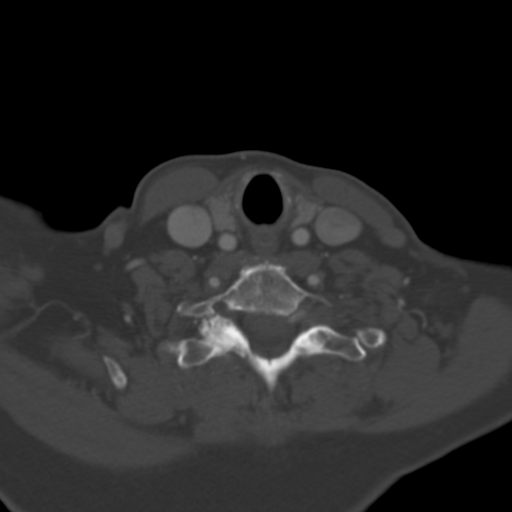
[im 81/122  bone]
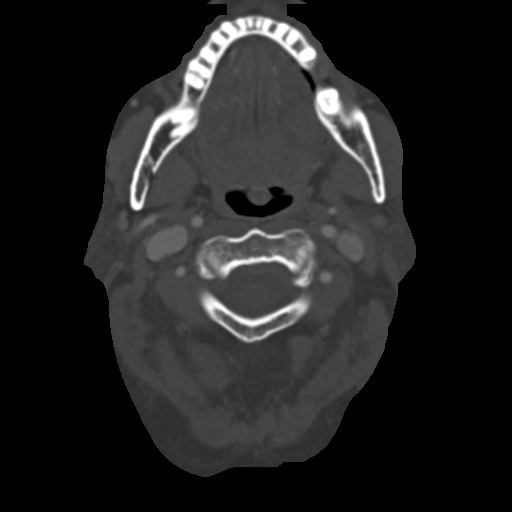

[Series 602: neck axials · axial · 0.48mm/px · z∈[+1616,+1693]mm · 2 of 120 slices shown]
[im 40/120  bone]
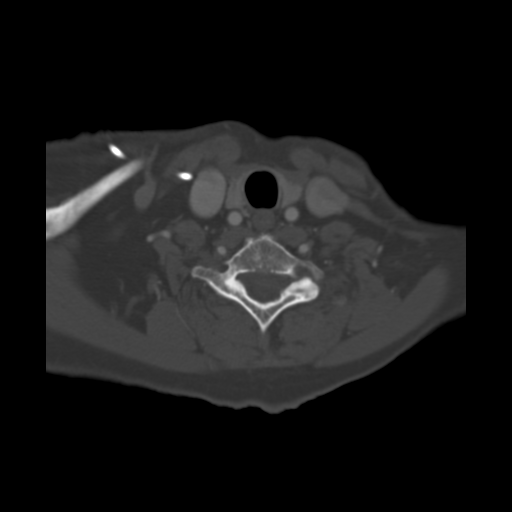
[im 80/120  bone]
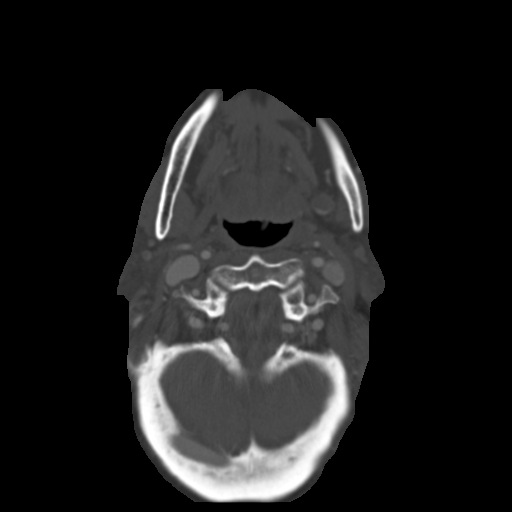

[Series 605: coronal cap · coronal · 1.22mm/px · 2 of 132 slices shown]
[im 44/132  bone]
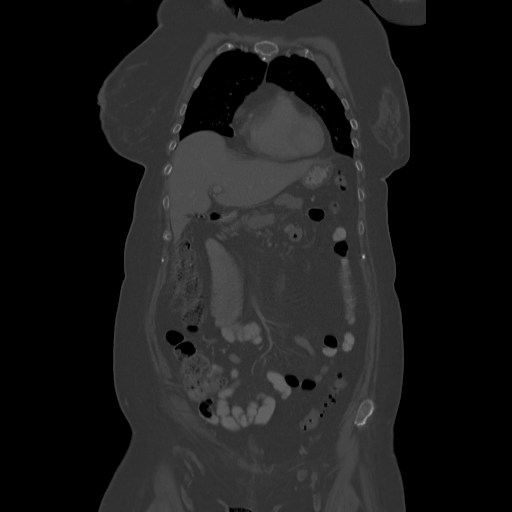
[im 88/132  bone]
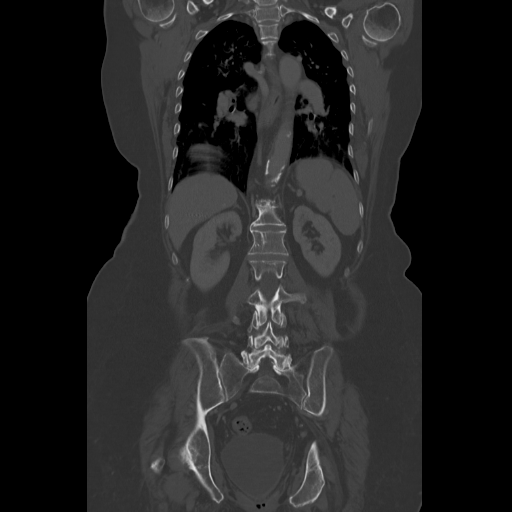

[Series 606: sagittal cap · sagittal · 1.22mm/px · 5 of 178 slices shown]
[im 45/178  bone]
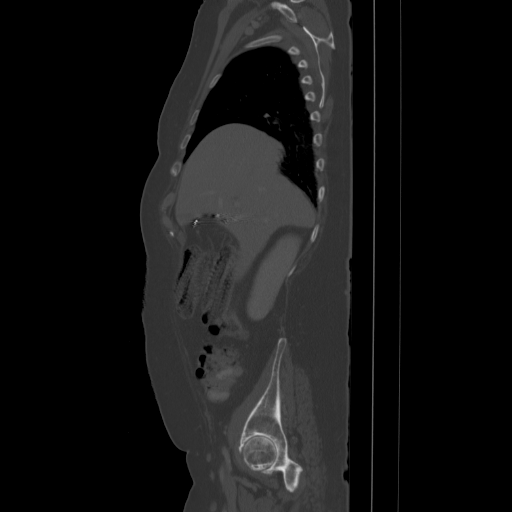
[im 67/178  bone]
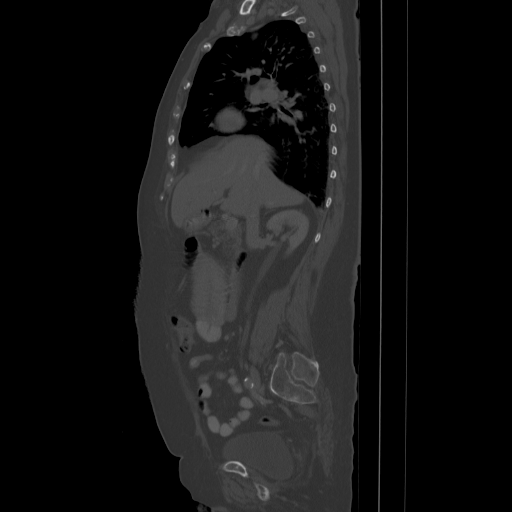
[im 89/178  bone]
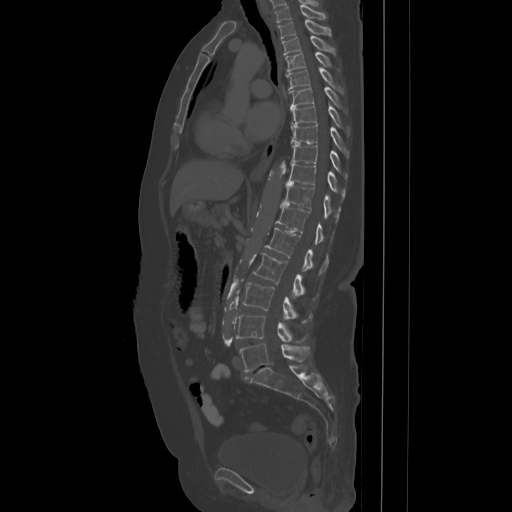
[im 111/178  bone]
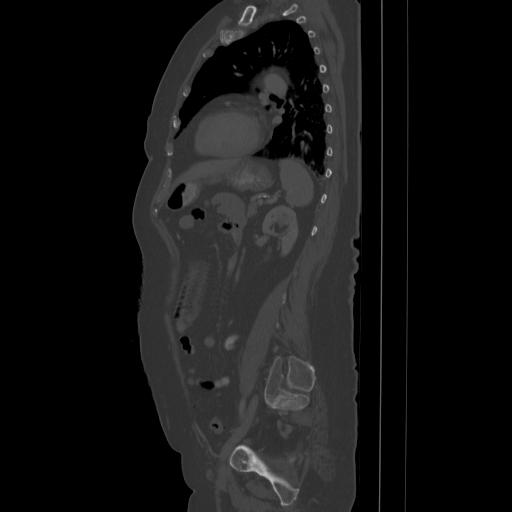
[im 133/178  bone]
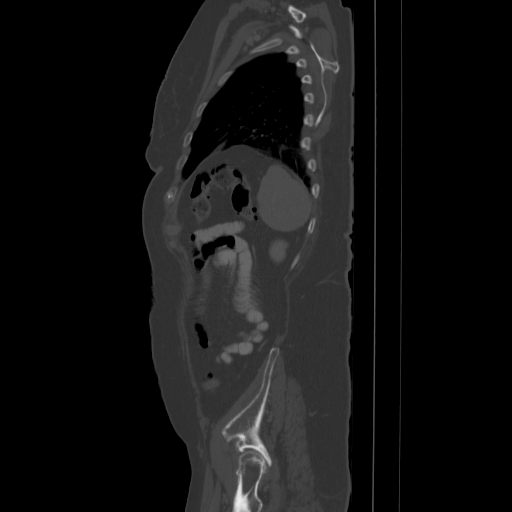

[14 of 33 positions shown; findings below may reference images not displayed]

FINDINGS: CT NECK FINDINGS

No cervical lymphadenopathy. No submental adenopathy. Salivary
glands are normal. Pharyngeal mucosa symmetric. Interval resolution
of the previous cervical / level 2 lymphadenopathy. No pathologic
enlarged lymph nodes remain.

No evidence of lymphoma within inferior brain or orbits. Cortical
atrophy and ventricular dilatation are noted.

There is no aggressive osseous lesion.

CT CHEST FINDINGS

There is a port in the right anterior chest wall. No axillary or
supraclavicular lymphadenopathy. No mediastinal hilar
lymphadenopathy. No pericardial fluid.

Previous enlarged axillary lymph nodes are normal size. For example
11 mm left external lymph node (image 13) decreased from 26 mm on
prior.

CT ABDOMEN AND PELVIS FINDINGS

No focal hepatic lesion. Post cholecystectomy. Pancreas, spleen,
adrenal glands, and kidneys are normal. The stomach, small bowel,
appendix, and cecum are normal. The colon demonstrates multiple
diverticula of the descending colon. Abdominal aorta is normal
caliber.

No retroperitoneal or periportal lymphadenopathy. Resolution of the
retroperitoneal adenopathy seen on comparison exam. No free fluid
the pelvis. Post hysterectomy anatomy. There is some laxity of the
pelvic with the inferior wall of the bladder extending below the
pubic symphysis. No aggressive osseous lesion.

Resolution of the inguinal adenopathy.
IMPRESSION: CT NECK IMPRESSION

No evidence of lymphoma recurrence.  Resolution cervical adenopathy.

CT CHEST IMPRESSION

No evidence of lymphoma recurrence. Resolution of axillary
adenopathy.

CT ABDOMEN AND PELVIS IMPRESSION

1. No evidence of lymphoma recurrence.
2. Resolution retroperitoneal and inguinal adenopathy.
3. Mild diverticulosis .
4. Pelvic floor laxity.
